# Patient Record
Sex: Female | Born: 1960
Health system: Southern US, Community
[De-identification: ages and names within clinical notes are randomized; demographics above are authoritative.]

## PROBLEM LIST (undated history)

## (undated) DIAGNOSIS — E119 Type 2 diabetes mellitus without complications: Secondary | ICD-10-CM

## (undated) DIAGNOSIS — C50911 Malignant neoplasm of unspecified site of right female breast: Secondary | ICD-10-CM

## (undated) DIAGNOSIS — M19019 Primary osteoarthritis, unspecified shoulder: Secondary | ICD-10-CM

## (undated) DIAGNOSIS — I10 Essential (primary) hypertension: Secondary | ICD-10-CM

## (undated) DIAGNOSIS — E785 Hyperlipidemia, unspecified: Secondary | ICD-10-CM

## (undated) DIAGNOSIS — Z923 Personal history of irradiation: Secondary | ICD-10-CM

## (undated) DIAGNOSIS — N63 Unspecified lump in unspecified breast: Secondary | ICD-10-CM

## (undated) DIAGNOSIS — Z9221 Personal history of antineoplastic chemotherapy: Secondary | ICD-10-CM

## (undated) HISTORY — DX: Unspecified lump in unspecified breast: N63.0

## (undated) HISTORY — DX: Primary osteoarthritis, unspecified shoulder: M19.019

## (undated) HISTORY — DX: Type 2 diabetes mellitus without complications: E11.9

## (undated) HISTORY — DX: Essential (primary) hypertension: I10

## (undated) HISTORY — DX: Malignant neoplasm of unspecified site of right female breast: C50.911

## (undated) HISTORY — DX: Hyperlipidemia, unspecified: E78.5

---

## 2003-07-28 DIAGNOSIS — I10 Essential (primary) hypertension: Secondary | ICD-10-CM

## 2003-07-28 HISTORY — DX: Essential (primary) hypertension: I10

## 2008-12-25 DIAGNOSIS — C50911 Malignant neoplasm of unspecified site of right female breast: Secondary | ICD-10-CM

## 2008-12-25 HISTORY — DX: Malignant neoplasm of unspecified site of right female breast: C50.911

## 2009-07-27 HISTORY — PX: BREAST LUMPECTOMY: SHX2

## 2010-12-11 ENCOUNTER — Ambulatory Visit (INDEPENDENT_AMBULATORY_CARE_PROVIDER_SITE_OTHER): Payer: Self-pay | Admitting: Family Medicine

## 2010-12-11 ENCOUNTER — Encounter: Payer: Self-pay | Admitting: Family Medicine

## 2010-12-11 DIAGNOSIS — I10 Essential (primary) hypertension: Secondary | ICD-10-CM

## 2010-12-11 DIAGNOSIS — E785 Hyperlipidemia, unspecified: Secondary | ICD-10-CM

## 2010-12-11 DIAGNOSIS — Z853 Personal history of malignant neoplasm of breast: Secondary | ICD-10-CM | POA: Insufficient documentation

## 2010-12-11 DIAGNOSIS — K219 Gastro-esophageal reflux disease without esophagitis: Secondary | ICD-10-CM

## 2010-12-11 DIAGNOSIS — C50919 Malignant neoplasm of unspecified site of unspecified female breast: Secondary | ICD-10-CM

## 2010-12-11 DIAGNOSIS — C50911 Malignant neoplasm of unspecified site of right female breast: Secondary | ICD-10-CM

## 2010-12-11 MED ORDER — PRAVASTATIN SODIUM 40 MG PO TABS: 40.0000 mg | ORAL_TABLET | Freq: Every evening | ORAL | Status: DC

## 2010-12-11 MED ORDER — RANITIDINE HCL 150 MG PO TABS: 150.0000 mg | ORAL_TABLET | Freq: Two times a day (BID) | ORAL | Status: DC

## 2010-12-11 MED ORDER — LISINOPRIL 10 MG PO TABS: 10.0000 mg | ORAL_TABLET | Freq: Every day | ORAL | Status: DC

## 2010-12-11 NOTE — Patient Instructions (Addendum)
Mrs. Carol Ward,  Thank you for coming in today. It was a pleasure meeting you and Mr. Leighton Roach I have sent prescriptions to your pharmacy. They are all $9 for 3 mos supply.   Please call and schedule an appointment with Jaynee Eagles if you have your orange card I will refer you to [facility] for your mammogram due in June.   Please return in 2-3 weeks (preferably after you have your orange card) for BP check-up.   Between now and when we see each other again, please walk for exercise 3-4 x per week.

## 2010-12-24 ENCOUNTER — Other Ambulatory Visit: Payer: Self-pay | Admitting: Family Medicine

## 2010-12-24 ENCOUNTER — Telehealth: Payer: Self-pay | Admitting: Family Medicine

## 2010-12-24 DIAGNOSIS — Z9889 Other specified postprocedural states: Secondary | ICD-10-CM

## 2010-12-24 NOTE — Telephone Encounter (Signed)
Mr. Khatoon called to inquire about referral his wife was waiting for to have a mammogram.  She presented the [facility] card from [facility], but still haven't heard anything.  According to notes from visit this was to be scheduled for June.  Please contact pt when referral and appt has been made

## 2010-12-25 NOTE — Telephone Encounter (Signed)
[facility] at 9am. Patient husband informed

## 2011-01-12 NOTE — Progress Notes (Signed)
  Subjective:    Patient ID: Carol Ward, female    DOB: [birth year], 50 y.o.   MRN: [id number]  HPI New patient to [facility] here to establish primary care. Patient has moved to [place] from [place] where she previously received her care. She has a past medical history significant for R breast cancer (dx June 2010) s/p chemotherapy, lumpectomy (Jan 2011) and external beam radiation therapy (Feb-April 2011) , HLD, HTN, GERD and situational depression/anxiety.   Regarding her R breast cancer, she is currently cancer free per her last biopsy and mammogram and is now on the schedule for yearly mammograms, next mammogram is due January 07, 2011. She denies breast pain, nipple discharge or new lumps on self breast exam.   Regarding her HTN she is taking an ACE from [place] (Blokium which is equivalent to lisinopril) since 2005. She states the she experiences headaches when her BP is high. She denies CP, SOB, LE edema. She has blood work  done at [facility] in [place] on 12/01/10. Her Cr 0.82, BUN 96.   Regarding her HLD she is taking a statin. On her blood work from earlier in the month, her Chol was 221, with LDL 107, HDL 44, TG 350. She is trending down compared to blood work from 2011. Her LFTs are all within normal limits and she denies muscle aches and pain.   Review of Systems  Constitutional: Negative.   Cardiovascular: Negative.   Neurological: Positive for headaches. Negative for dizziness, tremors, seizures, syncope, facial asymmetry, speech difficulty, weakness, light-headedness and numbness.  Hematological: Negative.        Objective:   Physical Exam  Nursing note and vitals reviewed. Constitutional: She is oriented to person, place, and time. She appears well-developed and well-nourished.  HENT:  Head: Normocephalic and atraumatic.  Eyes: No scleral icterus.  Neck: Normal range of motion. Neck supple.  Cardiovascular: Normal rate, regular rhythm, normal heart sounds and intact distal  pulses.   Pulmonary/Chest: Effort normal and breath sounds normal. Right breast exhibits skin change. Right breast exhibits no inverted nipple, no mass, no nipple discharge and no tenderness. Left breast exhibits no inverted nipple, no mass, no nipple discharge, no skin change and no tenderness. Breasts are symmetrical.    Neurological: She is alert and oriented to person, place, and time. No cranial nerve deficit. Coordination normal.  Skin: Skin is warm and dry.          Assessment & Plan:

## 2011-01-20 ENCOUNTER — Encounter: Payer: Self-pay | Admitting: Family Medicine

## 2011-01-20 NOTE — Assessment & Plan Note (Signed)
Patient is asymptomatic currently. Plan to continue H2 blocker.

## 2011-01-20 NOTE — Assessment & Plan Note (Addendum)
Patient due for screening mammogram in June. Will refer once pt obtains orange card. Reviewed provided medical records, including labs, path reports. Will add pertinent information to patient's chart.

## 2011-01-20 NOTE — Assessment & Plan Note (Signed)
Improving. Plan to continue statin therapy.  Advise low fat diet and regular exercise plan.

## 2011-01-20 NOTE — Assessment & Plan Note (Signed)
Pt with slightly elevated BP at this visit and with report of symptom when BP elevated. Plan to start continue lisinopril, will start at 10 mg PO q D and gradually increase dose to maintain adequate control.  Will ask pt to check BP at home and keep log and associated symptoms.

## 2011-01-26 ENCOUNTER — Ambulatory Visit
Admission: RE | Admit: 2011-01-26 | Discharge: 2011-01-26 | Disposition: A | Discharge status: Home / Self Care | Payer: Self-pay | Source: Ambulatory Visit | Attending: Family Medicine | Admitting: Family Medicine

## 2011-01-26 DIAGNOSIS — Z9889 Other specified postprocedural states: Secondary | ICD-10-CM

## 2011-01-29 ENCOUNTER — Ambulatory Visit (INDEPENDENT_AMBULATORY_CARE_PROVIDER_SITE_OTHER): Payer: Self-pay | Admitting: Family Medicine

## 2011-01-29 ENCOUNTER — Encounter: Payer: Self-pay | Admitting: Family Medicine

## 2011-01-29 VITALS — BP 110/76 | HR 82 | Temp 98.3°F | Wt 189.0 lb

## 2011-01-29 DIAGNOSIS — M19019 Primary osteoarthritis, unspecified shoulder: Secondary | ICD-10-CM

## 2011-01-29 DIAGNOSIS — Z853 Personal history of malignant neoplasm of breast: Secondary | ICD-10-CM

## 2011-01-29 DIAGNOSIS — I10 Essential (primary) hypertension: Secondary | ICD-10-CM

## 2011-01-29 HISTORY — DX: Primary osteoarthritis, unspecified shoulder: M19.019

## 2011-01-29 MED ORDER — CALCIUM CARBONATE-VITAMIN D 500-200 MG-UNIT PO TABS: 1.0000 | ORAL_TABLET | Freq: Every day | ORAL | Status: DC

## 2011-01-29 MED ORDER — LISINOPRIL 5 MG PO TABS: 5.0000 mg | ORAL_TABLET | Freq: Every day | ORAL | Status: DC

## 2011-01-29 MED ORDER — IBUPROFEN 800 MG PO TABS: 800.0000 mg | ORAL_TABLET | Freq: Three times a day (TID) | ORAL | Status: AC | PRN

## 2011-01-29 NOTE — Assessment & Plan Note (Addendum)
Likely source of pain. Given pt's history of malignancy concern for another malignancy, but pt denies nighttime symptoms, fever, chills, wt loss.  Plan: advil, Vit D and Calcium, weight bearing exercises as tolerated (limit weight bearing on R side).  Low threshold to obtain images of b/l shoulder and chest if symptoms worsen , nighttime symptoms occur given pt's risk of radiation induced sarcoma or secondary malignancy.

## 2011-01-29 NOTE — Patient Instructions (Signed)
Carol Ward,  Thank you for coming in today.  Your intermittent shoulder pain sounds like osteoarthritis you can use warm compresses and take ibuprofen when you have the pain or when you will be very active. Please start taking Calcium and Vitamin D.  F/u in 1 mos for BP check since we are changing the dose from 10 mg to 5 mg daily.   -Dr Armen Pickup

## 2011-01-29 NOTE — Assessment & Plan Note (Signed)
BP well controlled today, low/normal per pt log from home. Plan to decrease lisinopril from 10 to 5 mg PO q D. F/u med adjustment in 1 mos.

## 2011-01-29 NOTE — Progress Notes (Signed)
  Subjective:    Patient ID: Carol Ward, female    DOB: 1960-08-16, 50 y.o.   MRN: [id number]  HPI Subjective:  Carol Ward is a 50 y.o. female with hypertension. Current Outpatient Prescriptions  Medication Sig Dispense Refill  . lisinopril (PRINIVIL,ZESTRIL) 10 MG tablet Take 1 tablet (10 mg total) by mouth daily.  90 tablet  11  . pravastatin (PRAVACHOL) 40 MG tablet Take 1 tablet (40 mg total) by mouth every evening.  90 tablet  11  . ranitidine (ZANTAC) 150 MG tablet Take 1 tablet (150 mg total) by mouth 2 (two) times daily.  180 tablet  1    Hypertension ROS: taking medications as instructed, no medication side effects noted, no TIA's, no chest pain on exertion, no dyspnea on exertion and no swelling of ankles.  New concerns: B/l shoulder and arm pain and cracking for one month. Execrated by bending down during prayers. Pain is described in b/l shoulders as sharp. Limiting patients ability to do some activities (moving around serving dishes).  L sided pain last 2-3 days. Comes and goes. Denies N/V, change in appetite. No pain now. Pain located on L rib cage.   Objective:  BP 110/76  Pulse 82  Temp(Src) 98.3 F (36.8 C) (Oral)  Wt 189 lb (85.73 kg)  LMP 02/05/2009  Appearance alert, well appearing, and in no distress and overweight. General exam BP noted to be well controlled today in office, S1, S2 normal, no gallop, no murmur, chest clear, no JVD, no HSM, no edema, CVS exam  - normal rate, regular rhythm, normal S1, S2, no murmurs, rubs, clicks or gallops.  MSK: full ROM of b/l UE. Mild TTP b/l shoulders. Pt able to lift and hold arms overhead without worsening pain or arm fatigue.  Lab review: labs are reviewed, up to date and normal.   Assessment:   Hypertension well controlled.   Plan:  The following changes are to be made: decrease lisinoril from 0 to 5 mg PO q D.  Follow up: 1 month and as needed..    Review of Systems     Objective:   Physical Exam         Assessment & Plan:

## 2011-01-29 NOTE — Assessment & Plan Note (Signed)
Screening mammogram reviewed and normal.  F/u mammogram in one year.

## 2011-03-03 ENCOUNTER — Encounter: Payer: Self-pay | Admitting: Family Medicine

## 2011-03-03 ENCOUNTER — Other Ambulatory Visit: Payer: Self-pay | Admitting: Family Medicine

## 2011-03-03 ENCOUNTER — Ambulatory Visit (INDEPENDENT_AMBULATORY_CARE_PROVIDER_SITE_OTHER): Payer: Self-pay | Admitting: Family Medicine

## 2011-03-03 DIAGNOSIS — I10 Essential (primary) hypertension: Secondary | ICD-10-CM

## 2011-03-03 DIAGNOSIS — N6310 Unspecified lump in the right breast, unspecified quadrant: Secondary | ICD-10-CM | POA: Insufficient documentation

## 2011-03-03 DIAGNOSIS — N63 Unspecified lump in unspecified breast: Secondary | ICD-10-CM

## 2011-03-03 DIAGNOSIS — E785 Hyperlipidemia, unspecified: Secondary | ICD-10-CM

## 2011-03-03 DIAGNOSIS — K219 Gastro-esophageal reflux disease without esophagitis: Secondary | ICD-10-CM

## 2011-03-03 MED ORDER — RANITIDINE HCL 150 MG PO TABS: 150.0000 mg | ORAL_TABLET | Freq: Two times a day (BID) | ORAL | Status: DC

## 2011-03-03 MED ORDER — LISINOPRIL 5 MG PO TABS: 10.0000 mg | ORAL_TABLET | Freq: Every day | ORAL | Status: DC

## 2011-03-03 MED ORDER — PRAVASTATIN SODIUM 40 MG PO TABS: 40.0000 mg | ORAL_TABLET | Freq: Every evening | ORAL | Status: DC

## 2011-03-03 NOTE — Assessment & Plan Note (Addendum)
A: well controlled. At goal. Meds: pt compliant. No adverse SE. Plan: continue 10 mg PO q D.

## 2011-03-03 NOTE — Patient Instructions (Signed)
Carol Ward,  Thank you for coming in today.  Please go to the imaging center for a diagnostic ultrasound of that chest wall mass. I have sent a refill of your meds to [facility]  I will call you regarding f/u once I have the results of your ultrasound.   -Dr. Armen Pickup

## 2011-03-06 NOTE — Assessment & Plan Note (Signed)
A: Likely fibrosis from radiation. Mass is not cystic on exam. Pt with normal mammogram 2 mos ago. Given history of invasive breast cancer concern for malignancy is high. P: Referral for diagnostic mammogram and ultrasound. Will f/u results.

## 2011-03-06 NOTE — Progress Notes (Signed)
  Subjective:    Patient ID: Carol Ward, female    DOB: Mar 22, 1961, 50 y.o.   MRN: [id number]  HPI Pt here to f/u HTN and to discuss R breast/chest wall mass. 1. HTN. Compliant with lisinopril. Is taking 10 mg daily. Felt the BP was elevated when she took 5 mg reports feeling "bad". She gave BP ranges of 93-104/76 when taking 10 mg. And SBPs of 120-130s over DBP 80-96  when taking 5. She denies CP, SOB, syncope, cough.  2. R breast/chest wall mass: she noticed this about 1 month ago. She has had similar masses and came and went since she had her radiation for R breast cancer. She states that he mass is persistent. Somewhat tender. She denies fever, chills, weight loss, recent trauma to the area or itching.    Review of Systems As per HPI    Objective:   Physical Exam  Nursing note and vitals reviewed. Constitutional: She appears well-developed and well-nourished.  Cardiovascular: Normal rate, regular rhythm, normal heart sounds and intact distal pulses.   Pulmonary/Chest: Effort normal and breath sounds normal.    Musculoskeletal: She exhibits no edema.          Assessment & Plan:

## 2011-03-09 ENCOUNTER — Ambulatory Visit
Admission: RE | Admit: 2011-03-09 | Discharge: 2011-03-09 | Disposition: A | Discharge status: Home / Self Care | Payer: Self-pay | Source: Ambulatory Visit | Attending: Family Medicine | Admitting: Family Medicine

## 2011-03-09 DIAGNOSIS — N6310 Unspecified lump in the right breast, unspecified quadrant: Secondary | ICD-10-CM

## 2011-04-17 ENCOUNTER — Telehealth: Payer: Self-pay | Admitting: Family Medicine

## 2011-04-17 ENCOUNTER — Other Ambulatory Visit: Payer: Self-pay | Admitting: Family Medicine

## 2011-04-17 MED ORDER — QUINAPRIL HCL 10 MG PO TABS: 10.0000 mg | ORAL_TABLET | Freq: Every day | ORAL | Status: DC

## 2011-04-17 MED ORDER — ROSUVASTATIN CALCIUM 20 MG PO TABS: 20.0000 mg | ORAL_TABLET | Freq: Every day | ORAL | Status: DC

## 2011-04-17 NOTE — Telephone Encounter (Signed)
Received change in medication request from [facility] for free accupril and crestor/lipitor. Called pt to tell her that I would change her medications and send in the requested documentation, BPs and bloodwork. Left voicemail.

## 2011-05-13 ENCOUNTER — Other Ambulatory Visit: Payer: Self-pay | Admitting: Family Medicine

## 2011-05-13 DIAGNOSIS — M7989 Other specified soft tissue disorders: Secondary | ICD-10-CM

## 2011-06-15 ENCOUNTER — Other Ambulatory Visit: Payer: Self-pay | Admitting: Family Medicine

## 2011-06-15 ENCOUNTER — Ambulatory Visit
Admission: RE | Admit: 2011-06-15 | Discharge: 2011-06-15 | Disposition: A | Discharge status: Home / Self Care | Payer: Self-pay | Source: Ambulatory Visit | Attending: Internal Medicine | Admitting: Internal Medicine

## 2011-06-15 DIAGNOSIS — M7989 Other specified soft tissue disorders: Secondary | ICD-10-CM

## 2011-06-25 ENCOUNTER — Telehealth: Payer: Self-pay | Admitting: Family Medicine

## 2011-06-25 ENCOUNTER — Other Ambulatory Visit: Payer: Self-pay | Admitting: Family Medicine

## 2011-06-25 NOTE — Telephone Encounter (Signed)
Please call about in basket report that was sent to you for patient.  Call her at this number instead  [phone]

## 2011-06-25 NOTE — Telephone Encounter (Signed)
Called Carol Ward. Left VM letting her know that I reviewed the report. I asked her to call me back at clinic tomorrow or page me if she needs to speak with me urgently.

## 2011-07-06 ENCOUNTER — Other Ambulatory Visit: Payer: Self-pay

## 2011-12-25 ENCOUNTER — Encounter: Payer: Self-pay | Admitting: Family Medicine

## 2011-12-25 ENCOUNTER — Ambulatory Visit (INDEPENDENT_AMBULATORY_CARE_PROVIDER_SITE_OTHER): Payer: Self-pay | Admitting: Family Medicine

## 2011-12-25 VITALS — BP 108/73 | HR 92 | Temp 98.2°F | Ht 66.75 in | Wt 195.0 lb

## 2011-12-25 DIAGNOSIS — N6310 Unspecified lump in the right breast, unspecified quadrant: Secondary | ICD-10-CM

## 2011-12-25 DIAGNOSIS — N63 Unspecified lump in unspecified breast: Secondary | ICD-10-CM

## 2011-12-25 NOTE — Assessment & Plan Note (Signed)
Patient with lump of right breast that is causing pain and seems to be growing. Previously evaluated with ultrasound and mammogram. Previous cancer doctor in [place] and has not been seen since 2011. Consider biopsy and further evaluation for this patient with previous history of poorly differentiated intraductal carcinoma. Will send to surgery for evaluation.

## 2011-12-25 NOTE — Patient Instructions (Signed)
I have put in an order for your mammogram We are making an appointment for surgery.  I would like you to see them to be evaluated for possible biopsy

## 2011-12-25 NOTE — Progress Notes (Signed)
  Subjective:    Patient ID: Carol Ward, female    DOB: January 15, 1961, 51 y.o.   MRN: [id number]  HPI Presents for followup of lump mid axillary line on the right. She thinks it is enlarging and hurting. It feels like the pain radiates to her shoulder. She denies fevers, chills, weight loss. She denies nipple discharge. She has previously had chemotherapy, radiation, lumpectomy of this breast. She is last been seen by the surgeon who did the surgery in 2011. The surgeon was in [place] and she does not have a doctor to follow this year.  This lump has previously been followed with mammogram and ultrasound. The previous cancer is noted as a poorly differentiated intraductal carcinoma. Review of Systems See above     Objective:   Physical Exam  Vital signs reviewed General appearance - alert, well appearing, and in no distress Breast-surgical changes present on the right. There is an area approximately 1 cm large that is mobile and rubbery in the mid axillary line. No enlarged nodes in the axilla.      Assessment & Plan:

## 2011-12-29 ENCOUNTER — Other Ambulatory Visit: Payer: Self-pay | Admitting: Family Medicine

## 2011-12-29 MED ORDER — PRAVASTATIN SODIUM 40 MG PO TABS: 40.0000 mg | ORAL_TABLET | Freq: Every day | ORAL | Status: DC

## 2012-01-12 ENCOUNTER — Telehealth: Payer: Self-pay | Admitting: Family Medicine

## 2012-01-12 ENCOUNTER — Other Ambulatory Visit: Payer: Self-pay | Admitting: Family Medicine

## 2012-01-12 MED ORDER — LISINOPRIL 10 MG PO TABS: 10.0000 mg | ORAL_TABLET | Freq: Every day | ORAL | Status: DC

## 2012-01-12 NOTE — Telephone Encounter (Signed)
Patients husband is calling to speak with MD about his wifes Mammogram.

## 2012-01-15 NOTE — Telephone Encounter (Signed)
Called back and spoke to husband. His wife has an appt with a surgeon  she was referred by Dr. Hulen Luster and wants to know if they should go. I informed them that if there seems to be significant pain or changes in the breast they should. If not, they do not have to. There is already a plan for a f/u diagnostic mammogram for January 27, 2012.

## 2012-01-19 ENCOUNTER — Encounter (INDEPENDENT_AMBULATORY_CARE_PROVIDER_SITE_OTHER): Payer: Self-pay | Admitting: General Surgery

## 2012-01-19 ENCOUNTER — Ambulatory Visit (INDEPENDENT_AMBULATORY_CARE_PROVIDER_SITE_OTHER): Payer: PRIVATE HEALTH INSURANCE | Admitting: General Surgery

## 2012-01-19 VITALS — BP 124/80 | HR 68 | Temp 97.3°F | Resp 14 | Ht 67.0 in | Wt 195.4 lb

## 2012-01-19 DIAGNOSIS — N6310 Unspecified lump in the right breast, unspecified quadrant: Secondary | ICD-10-CM

## 2012-01-19 DIAGNOSIS — N63 Unspecified lump in unspecified breast: Secondary | ICD-10-CM

## 2012-01-19 NOTE — Assessment & Plan Note (Signed)
Pt with palpable area of thickening in anterior axillary line around 2 cm anterior to radiation tattoo. Likely benign.  Does not seem to have changed since description in November imaging.  Has follow up mammogram next week. Marked area with marker and tegaderm.  Even if negative films, would follow up in 3 months.

## 2012-01-19 NOTE — Patient Instructions (Addendum)
Get special attention to spot that I marked on mammogram next week.  Even if they say mammogram is OK, I want to see you back in 3 months to reexamine.

## 2012-01-19 NOTE — Progress Notes (Signed)
Chief Complaint  Patient presents with  . Mass    Breast - history of breast cancer    HISTORY: Patient had right breast cancer in 2010 in [place].  She had lumpectomy, sentinel lymph node biopsy, chemo and radiation for treatment.  She has area in her right axilla/chest wall that has been sore.  She moved here in the last year to [facility].  She has gotten a mammogram/ultrasound last fall that did not see any architectural distortion or mass in the area of thickening.  It does not hurt all the time, but does bother her frequently.  She has not had any other biopsies since the original surgery.  She has no family history of cancer.    Past Medical History  Diagnosis Date  . Hyperlipidemia 6/31/2011    Taking Simvistatin. LDL 217--127-107  . Hypertension 2005    Treated with ACE (Blokium in [place])  . Osteoarthritis of shoulder region 01/29/2011  . Breast cancer, right breast June 2010    Poorly differentiated intraductal carcinoma. s/p chemo, radiation and lumpectomy. Triple recepetro negative. BRAC1 adn BRCA2 negative.  . Lump or mass in breast     Past Surgical History  Procedure Date  . Breast lumpectomy Jan 2011    R breast     Current Outpatient Prescriptions  Medication Sig Dispense Refill  . lisinopril (PRINIVIL,ZESTRIL) 10 MG tablet Take 1 tablet (10 mg total) by mouth daily.  90 tablet  3  . pravastatin (PRAVACHOL) 40 MG tablet Take 1 tablet (40 mg total) by mouth daily.  90 tablet  3     No Known Allergies   History reviewed. No pertinent family history.   History   Social History  . Marital Status: Married    Spouse Name: N/A    Number of Children: 1  . Years of Education: N/A   Occupational History  . unemployed    Social History Main Topics  . Smoking status: Never Smoker   . Smokeless tobacco: Never Used  . Alcohol Use: No  . Drug Use: No  . Sexually Active: Yes -- Female partner(s)    Birth Control/ Protection: Post-menopausal   Other Topics Concern    . None   Social History Narrative   Moved to [place] 7 mos ago.Lives with husband [facility]) of 15 years and 35 yo son Alvenia Treese). Originally from [place].      REVIEW OF SYSTEMS - PERTINENT POSITIVES ONLY: 12 point review of systems negative other than HPI and PMH.  EXAM: Filed Vitals:   01/19/12 1013  BP: 124/80  Pulse: 68  Temp: 97.3 F (36.3 C)  Resp: 14    Gen:  No acute distress.  Well nourished and well groomed.   Neurological: Alert and oriented to person, place, and time. Coordination normal.  Head: Normocephalic and atraumatic.  Eyes: Conjunctivae are normal. Pupils are equal, round, and reactive to light. No scleral icterus.  Neck: Normal range of motion. Neck supple. No tracheal deviation or thyromegaly present.  Cardiovascular: Normal rate, regular rhythm, normal heart sounds and intact distal pulses.  Exam reveals no gallop and no friction rub.  No murmur heard. Respiratory: Effort normal.  No respiratory distress. No chest wall tenderness. Breath sounds normal.  No wheezes, rales or rhonchi.  Breast:  The right breast incision is under the nipple.  There is a little architectural distortion.  There is no mass in either breast proper.  There is a 1.5 cm area of thickening at 9  oclock near prior radiation tattoo.  This is mobile.   GI: Soft. Bowel sounds are normal. The abdomen is soft and nontender.  There is no rebound and no guarding.  Musculoskeletal: Normal range of motion. Extremities are nontender.  Lymphadenopathy: No cervical, preauricular, postauricular or axillary adenopathy is present.  There is no axillary lymphadenopathy.   Skin: Skin is warm and dry. No rash noted. No diaphoresis. No erythema. No pallor. No clubbing, cyanosis, or edema.   Psychiatric: Normal mood and affect. Behavior is normal. Judgment and thought content normal.    LABORATORY RESULTS: None available.     RADIOLOGY RESULTS: Fibrofatty tissue in the area of concern on  mammogram/ultrasound 05/2011    ASSESSMENT AND PLAN: Lump of breast, right Pt with palpable area of thickening in anterior axillary line around 2 cm anterior to radiation tattoo. Likely benign.  Does not seem to have changed since description in November imaging.  Has follow up mammogram next week. Marked area with marker and tegaderm.  Even if negative films, would follow up in 3 months.       Maudry Diego MD Surgical Oncology, General and Endocrine Surgery [facility]      Visit Diagnoses: 1. Lump of breast, right     Primary Care Physician: Dessa Phi, MD

## 2012-02-01 ENCOUNTER — Other Ambulatory Visit: Payer: Self-pay | Admitting: Family Medicine

## 2012-02-01 ENCOUNTER — Ambulatory Visit
Admission: RE | Admit: 2012-02-01 | Discharge: 2012-02-01 | Disposition: A | Discharge status: Home / Self Care | Payer: Self-pay | Source: Ambulatory Visit | Attending: Family Medicine | Admitting: Family Medicine

## 2012-02-01 DIAGNOSIS — N6310 Unspecified lump in the right breast, unspecified quadrant: Secondary | ICD-10-CM

## 2012-04-08 ENCOUNTER — Encounter (INDEPENDENT_AMBULATORY_CARE_PROVIDER_SITE_OTHER): Payer: Self-pay | Admitting: General Surgery

## 2012-04-08 ENCOUNTER — Ambulatory Visit (INDEPENDENT_AMBULATORY_CARE_PROVIDER_SITE_OTHER): Payer: PRIVATE HEALTH INSURANCE | Admitting: General Surgery

## 2012-04-08 VITALS — BP 118/86 | HR 98 | Temp 97.8°F | Resp 16 | Ht 67.0 in | Wt 194.4 lb

## 2012-04-08 DIAGNOSIS — Z853 Personal history of malignant neoplasm of breast: Secondary | ICD-10-CM

## 2012-04-08 NOTE — Progress Notes (Signed)
Chief Complaint  Patient presents with  . Routine Post Op    reck breast    HISTORY: Patient had right breast cancer in 2010 in [place].  She had lumpectomy, sentinel lymph node biopsy, chemo and radiation for treatment.  She has area in her right axilla/chest wall that has been sore.  It has improved somewhat since her visit 3 months ago.  She subsequently underwent ultrasound that was benign.  She has not had any other issues.      REVIEW OF SYSTEMS - PERTINENT POSITIVES ONLY: 12 point review of systems negative other than HPI and PMH.  EXAM: Filed Vitals:   04/08/12 1217  BP: 118/86  Pulse: 98  Temp: 97.8 F (36.6 C)  Resp: 16    Gen:  No acute distress.  Well nourished and well groomed.   Neurological: Alert and oriented to person, place, and time. Coordination normal.  Head: Normocephalic and atraumatic.  Eyes: Conjunctivae are normal. Pupils are equal, round, and reactive to light. No scleral icterus.  Neck: Normal range of motion. Neck supple. No tracheal deviation or thyromegaly present.  Cardiovascular: Normal rate, regular rhythm, normal heart sounds and intact distal pulses.  Exam reveals no gallop and no friction rub.  No murmur heard. Respiratory: Effort normal.  No respiratory distress.  Breast:  The right breast incision is under the nipple.  There is a little architectural distortion as detected previously.  There is no mass in either breast proper.  The area of concern is slightly smaller at 1 cm at right breast 9 oclock near prior radiation tattoo in the mid axillary line. Musculoskeletal: Normal range of motion. Extremities are nontender.  Lymphadenopathy: No cervical, preauricular, postauricular or axillary adenopathy is present.  There is no axillary lymphadenopathy.   Skin: Skin is warm and dry. No rash noted. No diaphoresis. No erythema. No pallor. No clubbing, cyanosis, or edema.   Psychiatric: Normal mood and affect. Behavior is normal. Judgment and thought  content normal.    RADIOLOGY RESULTS: Fibrofatty tissue in the area of concern on mammogram/ultrasound 05/2011  Ultrasound is performed, showing normal tissue is seen in the 9  o'clock region of the right breast 20 cm from the nipple. No solid  or cystic mass, abnormal shadowing or enlarged adenopathy is  detected.    ASSESSMENT AND PLAN: History of breast cancer Area of concern is smaller.  Recommend follow up in 1 year with me. Stay on schedule for mammograms.  Follow up with an oncologist.        Maudry Diego MD Surgical Oncology, General and Endocrine Surgery [facility]      Visit Diagnoses: 1. History of breast cancer     Primary Care Physician: Dessa Phi, MD

## 2012-04-08 NOTE — Assessment & Plan Note (Signed)
Area of concern is smaller.  Recommend follow up in 1 year with me. Stay on schedule for mammograms.  Follow up with an oncologist.

## 2012-04-08 NOTE — Patient Instructions (Signed)
Follow up with me in 1 year.  Get mammogram as scheduled (?may)  See oncology as well.

## 2012-04-15 ENCOUNTER — Encounter (INDEPENDENT_AMBULATORY_CARE_PROVIDER_SITE_OTHER): Payer: Self-pay | Admitting: General Surgery

## 2012-04-29 ENCOUNTER — Telehealth: Payer: Self-pay | Admitting: Family Medicine

## 2012-04-29 NOTE — Telephone Encounter (Signed)
having blurry vision and wants to go to eye doctor - she has the orange card

## 2012-04-29 NOTE — Telephone Encounter (Signed)
Needs appt. Fleeger, Maryjo [place]

## 2012-05-10 ENCOUNTER — Ambulatory Visit: Payer: Self-pay | Admitting: Family Medicine

## 2012-05-12 ENCOUNTER — Encounter: Payer: Self-pay | Admitting: Family Medicine

## 2012-05-12 ENCOUNTER — Ambulatory Visit (INDEPENDENT_AMBULATORY_CARE_PROVIDER_SITE_OTHER): Payer: Self-pay | Admitting: Family Medicine

## 2012-05-12 VITALS — BP 115/72 | HR 102 | Temp 98.1°F | Ht 67.0 in | Wt 195.0 lb

## 2012-05-12 DIAGNOSIS — Z23 Encounter for immunization: Secondary | ICD-10-CM

## 2012-05-12 DIAGNOSIS — I10 Essential (primary) hypertension: Secondary | ICD-10-CM

## 2012-05-12 DIAGNOSIS — E785 Hyperlipidemia, unspecified: Secondary | ICD-10-CM

## 2012-05-12 DIAGNOSIS — K056 Periodontal disease, unspecified: Secondary | ICD-10-CM

## 2012-05-12 DIAGNOSIS — K068 Other specified disorders of gingiva and edentulous alveolar ridge: Secondary | ICD-10-CM

## 2012-05-12 DIAGNOSIS — H538 Other visual disturbances: Secondary | ICD-10-CM

## 2012-05-12 DIAGNOSIS — K069 Disorder of gingiva and edentulous alveolar ridge, unspecified: Secondary | ICD-10-CM

## 2012-05-12 LAB — POCT SEDIMENTATION RATE: POCT SED RATE: 14 mm/hr (ref 0–22)

## 2012-05-12 MED ORDER — LISINOPRIL 10 MG PO TABS: 5.0000 mg | ORAL_TABLET | Freq: Two times a day (BID) | ORAL | Status: DC

## 2012-05-12 NOTE — Progress Notes (Signed)
Subjective:     Patient ID: Capri Huppert, female   DOB: 03-07-1961, 51 y.o.   MRN: [id number]  HPI 51 yo F presents to discuss the following:  1. Blurred vision x 2 months. Associated with mild L temporal headache and L lower gum pain. Wears contacts and reading glasses. Last eye exam last year in [place] was normal. No new prescription needed. No headache at this time. Blurred vision persistent.   2. High cholesterol: compliant with statin. Would like fasting lipid panel.   3. HTN: BP elevated over last two weeks. SBPs 144/DBP 92. Denies CP, SOB, LE edema. Compliant with lisinopril. Taking 5 mg BID.   Review of Systems As per HPI     Objective:   Physical Exam BP 115/72  Pulse 102  Temp 98.1 F (36.7 C) (Oral)  Ht 5\' 7"  (1.702 m)  Wt 195 lb (88.451 kg)  BMI 30.54 kg/m2  LMP 02/05/2009 General appearance: alert, cooperative and no distress Head: Normocephalic, without obvious abnormality, atraumatic, no temporal tenderness  Eyes: conjunctivae/corneas clear. PERRL, EOM's intact. Fundi benign. Ears: normal TM's and external ear canals both ears Nose: Nares normal. Septum midline. Mucosa normal. No drainage or sinus tenderness. Throat: lips, mucosa, and tongue normal; teeth and gums normal except for dental carries.  Lungs: clear to auscultation bilaterally Heart: regular rate and rhythm, S1, S2 normal, no murmur, click, rub or gallop Extremities: extremities normal, atraumatic, no cyanosis or edema     Assessment and Plan:

## 2012-05-12 NOTE — Assessment & Plan Note (Signed)
A: compliant with medications. P: Fasting lipid panel ordered.

## 2012-05-12 NOTE — Assessment & Plan Note (Signed)
A: associated with mild headache. Reassuring exam in office.  P: Referral to optometry.  Check sed rate to rule out temporal arteritis.

## 2012-05-12 NOTE — Assessment & Plan Note (Signed)
A: BP at goal. Meds: compliant with and tolerating.  P: Continue current management.

## 2012-05-12 NOTE — Assessment & Plan Note (Signed)
Referral to dentistry 

## 2012-05-12 NOTE — Patient Instructions (Signed)
Carol Ward,  Thank you for coming in to see me today.  Please get your sed rate done today to rule out temporal arteritis.  Please schedule a lab visit for fasting lipid panel tomorrow.   I have placed referrals to optometry and dentistry. My clinic staff will call you regarding these.   Dr. Armen Pickup

## 2012-05-13 ENCOUNTER — Other Ambulatory Visit: Payer: Self-pay

## 2012-05-13 DIAGNOSIS — E785 Hyperlipidemia, unspecified: Secondary | ICD-10-CM

## 2012-05-13 NOTE — Progress Notes (Signed)
FLP DONE TODAY Carol Ward 

## 2012-05-14 LAB — LIPID PANEL
Cholesterol: 176 mg/dL (ref 0–200)
Total CHOL/HDL Ratio: 4.2 Ratio
Triglycerides: 216 mg/dL — ABNORMAL HIGH (ref <150)
VLDL: 43 mg/dL — ABNORMAL HIGH (ref 0–40)

## 2012-05-16 ENCOUNTER — Encounter: Payer: Self-pay | Admitting: Family Medicine

## 2012-07-01 ENCOUNTER — Telehealth: Payer: Self-pay | Admitting: Family Medicine

## 2012-07-01 NOTE — Telephone Encounter (Signed)
Needs to talk to nurse about being referred for glaucoma - went to regular eye doctor and was told she needs to go see a glaucoma specialist.

## 2012-07-01 NOTE — Telephone Encounter (Signed)
Advised husband that since pt has the orange card this could be a problem.  Agreeable to making appt. Fleeger, Maryjo [place]

## 2012-07-12 ENCOUNTER — Ambulatory Visit (INDEPENDENT_AMBULATORY_CARE_PROVIDER_SITE_OTHER): Payer: PRIVATE HEALTH INSURANCE | Admitting: Family Medicine

## 2012-07-12 ENCOUNTER — Encounter: Payer: Self-pay | Admitting: Family Medicine

## 2012-07-12 VITALS — BP 122/81 | HR 59 | Temp 98.5°F | Ht 67.0 in | Wt 197.0 lb

## 2012-07-12 DIAGNOSIS — H402290 Chronic angle-closure glaucoma, unspecified eye, stage unspecified: Secondary | ICD-10-CM

## 2012-07-12 DIAGNOSIS — H40059 Ocular hypertension, unspecified eye: Secondary | ICD-10-CM

## 2012-07-12 DIAGNOSIS — H40009 Preglaucoma, unspecified, unspecified eye: Secondary | ICD-10-CM

## 2012-07-12 DIAGNOSIS — H409 Unspecified glaucoma: Secondary | ICD-10-CM

## 2012-07-12 NOTE — Patient Instructions (Addendum)
Mr and Mrs. Houdek,  Thank you for coming in today.  It appears that you have chronic closed angle glaucoma. Elevated eye pressure due to lack of fluid drainage. This is a serious problem that threatens the vision and will lead to blindness is untreated.   I spoke to Dr. Parke Simmers today. Her office staff have coordinated a visit with a glaucoma specialist at [facility]). The cost of the consultation is $150. You will have to set up a payment plan for the procedure. Please keep this appointment. This is an urgent matter.   If you do not receive a call from Dr. Tedra Senegal tech this morning please call them for the appointment details at [phone].  Dr. Armen Pickup

## 2012-07-12 NOTE — Assessment & Plan Note (Signed)
F/u with glaucoma specialist per AVS.

## 2012-07-12 NOTE — Progress Notes (Signed)
Subjective:     Patient ID: Carol Ward, female   DOB: February 09, 1961, 51 y.o.   MRN: [id number]  HPI 51 yo F presents with her husband to discuss a referral to a glaucoma specialist. She has been evaluated twice by Dr. Parke Simmers OD ([facility] [phone]) and has been diagnosed with chronic closed angle glaucoma with iuntraocular pressures in the 40s. She denies vision loss. She and her husband are concerned about the cost of the visit which will not be covered by project access (orange card), and want know if they can wait for a provider that take her insurance.   I spoke to Dr. Rodrigo Ran, [facility] [phone] on the phone while the patient was in the office. Dr. Tedra Senegal office staff have coordinated a visit with a glaucoma specialist at [facility]). The cost of the consultation is $150. The patieny will have to set up a payment plan for the procedure (laser eye surgery).   I discussed with the patient that the matter was urgent. Unfortunately, there is no urgent appointments available ophthalmology/glaucoma specialist. I stress to her the importance of the situation and advised that she keep the appointment with the glaucoma specialist at [facility].   Review of Systems As per HPI    Objective:   Physical Exam BP 122/81  Pulse 59  Temp 98.5 F (36.9 C) (Oral)  Ht 5\' 7"  (1.702 m)  Wt 197 lb (89.359 kg)  BMI 30.85 kg/m2  LMP 02/05/2009 General appearance: alert, cooperative and no distress     Assessment and Plan:

## 2012-08-25 ENCOUNTER — Telehealth: Payer: Self-pay | Admitting: Family Medicine

## 2012-08-25 DIAGNOSIS — H538 Other visual disturbances: Secondary | ICD-10-CM

## 2012-08-25 NOTE — Telephone Encounter (Signed)
Husband is calling for a referral for a specialist for contacts.  The eye doctor they went to is recommending Dr. Lonia Chimera.

## 2012-08-25 NOTE — Telephone Encounter (Signed)
Spoke with pts husband.  The ophthalmologist that saw her only specializes in glaucoma.  They would like to see an optometrist so she can check what her vision is and get the rx's from [place].  I advised that orange card may not cover this but we could send a referral and see what they say.  Pts husband is agreeable. Will forward to MD for referral placement. Milyn Stapleton, Maryjo Rochester

## 2012-08-25 NOTE — Telephone Encounter (Signed)
Referral placed.

## 2012-10-03 ENCOUNTER — Telehealth: Payer: Self-pay | Admitting: Family Medicine

## 2012-10-03 NOTE — Telephone Encounter (Signed)
Husband is calling because the medication Pravastatin is too expensive and the pharmacist suggested Lovastatin at a 90 day supply.  This will save them $30.  The patient does have about 2 - 3 days left.  They use [facility] on [address].

## 2012-10-04 MED ORDER — LOVASTATIN 40 MG PO TABS: 40.0000 mg | ORAL_TABLET | Freq: Every day | ORAL | Status: DC

## 2012-10-04 NOTE — Telephone Encounter (Signed)
Called patient. Ok to change. Change made with 90 day supply

## 2012-10-05 ENCOUNTER — Other Ambulatory Visit: Payer: Self-pay | Admitting: Family Medicine

## 2012-10-05 MED ORDER — LOVASTATIN 20 MG PO TABS: 40.0000 mg | ORAL_TABLET | Freq: Every day | ORAL | Status: DC

## 2012-12-26 ENCOUNTER — Other Ambulatory Visit: Payer: Self-pay | Admitting: Family Medicine

## 2012-12-26 DIAGNOSIS — Z853 Personal history of malignant neoplasm of breast: Secondary | ICD-10-CM

## 2013-02-01 ENCOUNTER — Ambulatory Visit
Admission: RE | Admit: 2013-02-01 | Discharge: 2013-02-01 | Disposition: A | Discharge status: Home / Self Care | Payer: No Typology Code available for payment source | Source: Ambulatory Visit | Attending: Family Medicine | Admitting: Family Medicine

## 2013-02-01 DIAGNOSIS — Z853 Personal history of malignant neoplasm of breast: Secondary | ICD-10-CM

## 2013-02-27 ENCOUNTER — Telehealth: Payer: Self-pay | Admitting: *Deleted

## 2013-02-27 NOTE — Telephone Encounter (Signed)
Request form completed by patient's husband.  Form states "No complaint.  Prefers to be seen by female doctor.  More comfortable with female doctor."   Changed PCP from Dr. Konrad Dolores to Dr. Benjamin Stain.  Senaida Ores, Maryjean Ka, RN

## 2013-02-27 NOTE — Telephone Encounter (Signed)
Carol Ward,  The request was for his wife, not him. That being said if he would prefer to have both w/ 1 provider then making change is fine.

## 2013-02-27 NOTE — Telephone Encounter (Signed)
Husband does not want to change his PCP and will continue to have Dr. Konrad Dolores as PCP.  Gaylene Brooks, RN

## 2013-02-28 ENCOUNTER — Ambulatory Visit: Payer: No Typology Code available for payment source

## 2013-03-01 NOTE — Telephone Encounter (Signed)
Thanks for letting me know!

## 2013-03-28 ENCOUNTER — Other Ambulatory Visit: Payer: Self-pay | Admitting: *Deleted

## 2013-03-28 MED ORDER — LISINOPRIL 5 MG PO TABS: 5.0000 mg | ORAL_TABLET | Freq: Two times a day (BID) | ORAL | Status: DC

## 2013-04-03 ENCOUNTER — Other Ambulatory Visit: Payer: Self-pay | Admitting: Family Medicine

## 2013-04-03 MED ORDER — LISINOPRIL 5 MG PO TABS: 5.0000 mg | ORAL_TABLET | Freq: Two times a day (BID) | ORAL | Status: DC

## 2013-04-04 ENCOUNTER — Other Ambulatory Visit: Payer: Self-pay | Admitting: Family Medicine

## 2013-04-04 MED ORDER — LISINOPRIL 10 MG PO TABS: 10.0000 mg | ORAL_TABLET | Freq: Every day | ORAL | Status: DC

## 2013-04-17 ENCOUNTER — Encounter (INDEPENDENT_AMBULATORY_CARE_PROVIDER_SITE_OTHER): Payer: Self-pay | Admitting: General Surgery

## 2013-04-17 ENCOUNTER — Ambulatory Visit (INDEPENDENT_AMBULATORY_CARE_PROVIDER_SITE_OTHER): Payer: PRIVATE HEALTH INSURANCE | Admitting: General Surgery

## 2013-04-17 VITALS — BP 116/80 | HR 80 | Temp 97.4°F | Resp 15 | Ht 68.0 in | Wt 193.2 lb

## 2013-04-17 DIAGNOSIS — Z853 Personal history of malignant neoplasm of breast: Secondary | ICD-10-CM

## 2013-04-17 NOTE — Patient Instructions (Addendum)
Get mammogram next summer.  Follow up in 1 year unless you find concerning spot in breast.    Call earlier if needed.

## 2013-04-21 NOTE — Assessment & Plan Note (Addendum)
No evidence of disease.  Follow up next summer with mammogram, then 1 year with me.    Follow up with oncology

## 2013-04-25 NOTE — Progress Notes (Signed)
Chief Complaint  Patient presents with  . Breast Cancer Long Term Follow Up    LTFU yearly br recheck    HISTORY: Patient had right breast cancer in 2010 in [place].  She had lumpectomy, sentinel lymph node biopsy, chemo and radiation for treatment.  She had area in her right axilla/chest wall that had been sore. This is not bothering her anymore.     She had normal mammogram in July that was reassuring.  She has not had any new breast masses or areas of concern.     REVIEW OF SYSTEMS - PERTINENT POSITIVES ONLY: 12 point review of systems negative other than HPI and PMH.  EXAM: Filed Vitals:   04/17/13 1055  BP: 116/80  Pulse: 80  Temp: 97.4 F (36.3 C)  Resp: 15    Gen:  No acute distress.  Well nourished and well groomed.   Neurological: Alert and oriented to person, place, and time. Coordination normal.  Head: Normocephalic and atraumatic.  Eyes: Conjunctivae are normal. Pupils are equal, round, and reactive to light. No scleral icterus.  Neck: Normal range of motion. Neck supple. No tracheal deviation or thyromegaly present.  Cardiovascular: Normal rate, regular rhythm Respiratory: Effort normal.  No respiratory distress.  Breast:  The right breast incision is under the nipple.  There are no masses or skin dimpling.  She does not have any lymphadenopathy.  She has no nipple retraction nipple discharge.   Musculoskeletal: Normal range of motion. Extremities are nontender.  Lymphadenopathy: No cervical, preauricular, postauricular or axillary adenopathy is present.  There is no axillary lymphadenopathy.   Skin: Skin is warm and dry. No rash noted. No diaphoresis. No erythema. No pallor. No clubbing, cyanosis, or edema.   Psychiatric: Normal mood and affect. Behavior is normal. Judgment and thought content normal.    RADIOLOGY RESULTS:  Mammogram 01/2013 IMPRESSION:  No mammographic evidence of breast malignancy.  Right breast scarring.  BI-RADS CATEGORY 2: Benign  finding(s).     ASSESSMENT AND PLAN: History of breast cancer No evidence of disease.  Follow up next summer with mammogram  Follow up in 1 year.  At that point, she will be 5 years out, and can likely move to follow up with PCP.  Follow up with oncology       Maudry Diego MD Surgical Oncology, General and Endocrine Surgery [facility]      Visit Diagnoses: 1. History of breast cancer     Primary Care Physician: Simone Curia, MD

## 2013-07-03 ENCOUNTER — Other Ambulatory Visit: Payer: Self-pay | Admitting: Family Medicine

## 2013-07-05 ENCOUNTER — Other Ambulatory Visit: Payer: Self-pay | Admitting: Family Medicine

## 2013-07-06 NOTE — Telephone Encounter (Signed)
I refilled this 12/8 already. This appears to be a duplicate request.  Leona Singleton, MD

## 2013-07-11 ENCOUNTER — Encounter (INDEPENDENT_AMBULATORY_CARE_PROVIDER_SITE_OTHER): Payer: PRIVATE HEALTH INSURANCE | Admitting: General Surgery

## 2013-07-24 ENCOUNTER — Encounter (INDEPENDENT_AMBULATORY_CARE_PROVIDER_SITE_OTHER): Payer: Self-pay | Admitting: General Surgery

## 2013-07-24 ENCOUNTER — Ambulatory Visit (INDEPENDENT_AMBULATORY_CARE_PROVIDER_SITE_OTHER): Payer: PRIVATE HEALTH INSURANCE | Admitting: General Surgery

## 2013-07-24 ENCOUNTER — Other Ambulatory Visit (INDEPENDENT_AMBULATORY_CARE_PROVIDER_SITE_OTHER): Payer: Self-pay

## 2013-07-24 ENCOUNTER — Encounter (INDEPENDENT_AMBULATORY_CARE_PROVIDER_SITE_OTHER): Payer: Self-pay

## 2013-07-24 VITALS — BP 116/72 | HR 72 | Temp 98.4°F | Resp 14 | Ht 66.0 in | Wt 195.6 lb

## 2013-07-24 DIAGNOSIS — Z853 Personal history of malignant neoplasm of breast: Secondary | ICD-10-CM

## 2013-07-24 DIAGNOSIS — I890 Lymphedema, not elsewhere classified: Secondary | ICD-10-CM | POA: Insufficient documentation

## 2013-07-24 DIAGNOSIS — M7989 Other specified soft tissue disorders: Secondary | ICD-10-CM

## 2013-07-24 NOTE — Patient Instructions (Signed)
We will refer you to physical therapy for lymph flow therapy.    I will see you back this summer with mammograms.

## 2013-07-24 NOTE — Assessment & Plan Note (Signed)
I think the swelling that she was having in her axilla and upper breast represent lymphedema. She did have an infection in her right arm. This triggered the increased swelling. I will refer her for lymph flow therapy

## 2013-07-24 NOTE — Assessment & Plan Note (Signed)
I will see the patient back in August. At this point she should have had her yearly mammogram.

## 2013-07-24 NOTE — Progress Notes (Signed)
HISTORY: Patient is a 52 year old female who presents with swelling and questionable nipple discharge on the right. This is the site where she underwent surgical treatment for breast cancer as well as radiation in 2010. This occurred in [place]. She denied trauma, mass, increased activity, or other issues. She had not moved or done any significant traveling.  Upon further questioning, she did have an infection on her hand and wrist on the right.   PERTINENT REVIEW OF SYSTEMS: Otherwise negative x 11  Filed Vitals:   07/24/13 1403  BP: 116/72  Pulse: 72  Temp: 98.4 F (36.9 C)  Resp: 14   Filed Weights   07/24/13 1403  Weight: 195 lb 9.6 oz (88.724 kg)    EXAM: Head: Normocephalic and atraumatic.  Eyes:  Conjunctivae are normal. Pupils are equal, round, and reactive to light. No scleral icterus.  Resp: No respiratory distress, normal effort. Breast:  Right breast smaller than left.  No palpable masses.  No nipple discharge at this time.   Neurological: Alert and oriented to person, place, and time. Coordination normal.  Skin: Skin is warm and dry. No diaphoretic. No erythema. No pallor.  she has a resolving rash between her index and third finger in the web space. She also has a rash on her lateral right wrist. Psychiatric: Normal mood and affect. Normal behavior. Judgment and thought content normal.      ASSESSMENT AND PLAN:   Lymphedema Right arm and right breast I think the swelling that she was having in her axilla and upper breast represent lymphedema. She did have an infection in her right arm. This triggered the increased swelling. I will refer her for lymph flow therapy  History of breast cancer I will see the patient back in August. At this point she should have had her yearly mammogram.      Maudry Diego, MD Surgical Oncology, General & Endocrine Surgery [facility]  Simone Curia, MD Leona Singleton, *

## 2013-08-16 ENCOUNTER — Ambulatory Visit: Payer: BC Managed Care – PPO | Attending: General Surgery | Admitting: Physical Therapy

## 2013-08-16 DIAGNOSIS — Z853 Personal history of malignant neoplasm of breast: Secondary | ICD-10-CM | POA: Insufficient documentation

## 2013-08-16 DIAGNOSIS — I890 Lymphedema, not elsewhere classified: Secondary | ICD-10-CM | POA: Insufficient documentation

## 2013-08-16 DIAGNOSIS — IMO0001 Reserved for inherently not codable concepts without codable children: Secondary | ICD-10-CM | POA: Insufficient documentation

## 2013-08-16 DIAGNOSIS — M25519 Pain in unspecified shoulder: Secondary | ICD-10-CM | POA: Insufficient documentation

## 2013-08-16 DIAGNOSIS — R079 Chest pain, unspecified: Secondary | ICD-10-CM | POA: Insufficient documentation

## 2013-08-22 ENCOUNTER — Ambulatory Visit: Payer: BC Managed Care – PPO | Admitting: Physical Therapy

## 2013-08-24 ENCOUNTER — Ambulatory Visit: Payer: BC Managed Care – PPO | Admitting: Physical Therapy

## 2013-08-29 ENCOUNTER — Ambulatory Visit: Payer: BC Managed Care – PPO | Attending: General Surgery | Admitting: Physical Therapy

## 2013-08-29 DIAGNOSIS — I890 Lymphedema, not elsewhere classified: Secondary | ICD-10-CM | POA: Insufficient documentation

## 2013-08-29 DIAGNOSIS — R079 Chest pain, unspecified: Secondary | ICD-10-CM | POA: Insufficient documentation

## 2013-08-29 DIAGNOSIS — Z853 Personal history of malignant neoplasm of breast: Secondary | ICD-10-CM | POA: Insufficient documentation

## 2013-08-29 DIAGNOSIS — IMO0001 Reserved for inherently not codable concepts without codable children: Secondary | ICD-10-CM | POA: Insufficient documentation

## 2013-08-29 DIAGNOSIS — M25519 Pain in unspecified shoulder: Secondary | ICD-10-CM | POA: Insufficient documentation

## 2013-08-31 ENCOUNTER — Ambulatory Visit: Payer: BC Managed Care – PPO | Admitting: Physical Therapy

## 2013-09-05 ENCOUNTER — Ambulatory Visit: Payer: BC Managed Care – PPO | Admitting: Physical Therapy

## 2013-09-12 ENCOUNTER — Encounter: Payer: BC Managed Care – PPO | Admitting: Physical Therapy

## 2013-09-14 ENCOUNTER — Encounter: Payer: BC Managed Care – PPO | Admitting: Physical Therapy

## 2013-09-17 ENCOUNTER — Other Ambulatory Visit: Payer: Self-pay | Admitting: Family Medicine

## 2013-12-02 ENCOUNTER — Other Ambulatory Visit: Payer: Self-pay | Admitting: Family Medicine

## 2013-12-14 ENCOUNTER — Ambulatory Visit (INDEPENDENT_AMBULATORY_CARE_PROVIDER_SITE_OTHER): Payer: BC Managed Care – PPO | Admitting: Family Medicine

## 2013-12-14 ENCOUNTER — Encounter: Payer: Self-pay | Admitting: Family Medicine

## 2013-12-14 VITALS — BP 114/79 | HR 94 | Temp 98.2°F | Wt 190.0 lb

## 2013-12-14 DIAGNOSIS — R079 Chest pain, unspecified: Secondary | ICD-10-CM

## 2013-12-14 DIAGNOSIS — E785 Hyperlipidemia, unspecified: Secondary | ICD-10-CM

## 2013-12-14 DIAGNOSIS — L905 Scar conditions and fibrosis of skin: Secondary | ICD-10-CM

## 2013-12-14 DIAGNOSIS — R0781 Pleurodynia: Secondary | ICD-10-CM

## 2013-12-14 DIAGNOSIS — I10 Essential (primary) hypertension: Secondary | ICD-10-CM

## 2013-12-14 MED ORDER — LISINOPRIL 10 MG PO TABS: 10.0000 mg | ORAL_TABLET | Freq: Every day | ORAL | Status: DC

## 2013-12-14 NOTE — Progress Notes (Signed)
Patient ID: Carol Ward, female   DOB: [birth year], 53 y.o.   MRN: [id number] Subjective:   CC: Same day for rib pain and med refill  HPI:   Rib pain Per patient, sharp rib pain present 2 weeks bilateral ribs. Pain is not severe. Denies trauma. Olive oil massage helps. Denies fevers, chills, dyspnea, chest pain, rash, skin changes, swelling, bowel or bladder changes, or incontinence. Pain does not radiate. Worse when she lays on either side and if she stretches her leg up she sometimes feels muscular tightness.  HTN Patient needs med refill on lisinopril. She takes daily. She denies chest pain or dyspnea. She denies leg swelling. She does not report any medication side effects. She is overdue for BMET.  Face nodules Pt reports left chin and neck getting nodules that she has since scraped off with tweezers. She denies fevers/chills but wants them to be removed. They previously looked like another mole she has on her left maxilla.   Review of Systems - Per HPI.  Tobacco: Never smoker     Objective:  Physical Exam BP 114/79  Pulse 94  Temp(Src) 98.2 F (36.8 C) (Oral)  Wt 190 lb (86.183 kg)  LMP 02/05/2009 GEN: NAD, pleasant, English-speaking HEENT: Atraumatic, normocephalic, neck supple, EOMI, sclera clear  CV: RRR, no murmurs, rubs, or gallops PULM: CTAB, normal effort ABD: obese, Soft, nontender, nondistended, NABS, no organomegaly; no rib erythema, deformity, or swelling. No tenderness. SKIN: Left chin and neck with 1cm hyperpigmented round flat scar with fine punctate center. Otherwise, no rash or cyanosis; warm and well-perfused EXTR: No lower extremity edema or calf tenderness PSYCH: Mood and affect euthymic, normal rate and volume of speech NEURO: Awake, alert, no focal deficits grossly, normal speech  Assessment:     Carol Ward is a 53 y.o. female with h/o HTN here for f/u of HTN and with rib pain and facial nodules.    Plan:     Rib pain Likely MSK / possibly  costochondritis. No exam abnormalities, and tenderness is minimal on exam. Abdominal exam benign.  - Reassured. - Continue massage and use ibuprofen PRN. - If continues >2 weeks or point tenderness develops, would consider imaging to eval for any mets with h/o breast cancer. - Return precautions reviewed.  HTN Well controlled on lisinopril. - BMET as future lab to get with fasting lipid panel. - Refilled 3 months lisinopril with refills.  Face nodules Likely cysts but unable to visualize due to patient picking off. Today, only scar is present.  - Do not rub face. - Use gentle soap/unscented face lotion/sunscreen. - Return when nodule returns so we can evaluate and consider removal vs derm referral.  # Health Maintenance: - Return for well woman. - lipid panel on lab only as future order. - work on wt loss  Follow-up: Follow up when convenient within 1-2 months for well woman exam and f/u facial nodules.   Hilton Sinclair, MD [place]

## 2013-12-14 NOTE — Patient Instructions (Addendum)
Great to meet you.  I have refilled lisinopril for 90 day supply. Make an appointment with me when convenient for well woman exam. Make an appointment as lab-only appt in the morning when you can come fasting and we can get your metabolic panel then too Massage your ribs, and use ibuprofen as needed with food every 4-6 hours. If you get worsened symptoms, seek immediate care. For your face, do not pick or rub and use gentle soap and sunscreen. Show this to me when you come back.

## 2013-12-15 ENCOUNTER — Other Ambulatory Visit: Payer: BC Managed Care – PPO

## 2013-12-15 DIAGNOSIS — L905 Scar conditions and fibrosis of skin: Secondary | ICD-10-CM | POA: Insufficient documentation

## 2013-12-15 DIAGNOSIS — E785 Hyperlipidemia, unspecified: Secondary | ICD-10-CM

## 2013-12-15 DIAGNOSIS — I10 Essential (primary) hypertension: Secondary | ICD-10-CM

## 2013-12-15 DIAGNOSIS — R0781 Pleurodynia: Secondary | ICD-10-CM | POA: Insufficient documentation

## 2013-12-15 NOTE — Assessment & Plan Note (Signed)
Well controlled on lisinopril. - BMET as future lab to get with fasting lipid panel. - Refilled 3 months lisinopril with refills.

## 2013-12-15 NOTE — Assessment & Plan Note (Signed)
Likely MSK / possibly costochondritis. No exam abnormalities, and tenderness is minimal on exam. Abdominal exam benign.  - Reassured. - Continue massage and use ibuprofen PRN. - If continues >2 weeks or point tenderness develops, would consider imaging to eval for any mets with h/o breast cancer. - Return precautions reviewed.

## 2013-12-15 NOTE — Assessment & Plan Note (Signed)
Likely cysts but unable to visualize due to patient picking off. Today, only scar is present.  - Do not rub face. - Use gentle soap/unscented face lotion/sunscreen. - Return when nodule returns so we can evaluate and consider removal vs derm referral.

## 2013-12-15 NOTE — Progress Notes (Signed)
BMP AND FLP DONE TODAY Carol Ward

## 2013-12-16 LAB — BASIC METABOLIC PANEL
BUN: 12 mg/dL (ref 6–23)
CO2: 28 mEq/L (ref 19–32)
Calcium: 9.2 mg/dL (ref 8.4–10.5)
Chloride: 102 mEq/L (ref 96–112)
Creat: 0.83 mg/dL (ref 0.50–1.10)
GLUCOSE: 87 mg/dL (ref 70–99)
POTASSIUM: 4.1 meq/L (ref 3.5–5.3)
Sodium: 139 mEq/L (ref 135–145)

## 2013-12-16 LAB — LIPID PANEL
CHOL/HDL RATIO: 3.9 ratio
CHOLESTEROL: 143 mg/dL (ref 0–200)
HDL: 37 mg/dL — ABNORMAL LOW (ref >39)
LDL Cholesterol: 63 mg/dL (ref 0–99)
Triglycerides: 215 mg/dL — ABNORMAL HIGH (ref <150)
VLDL: 43 mg/dL — ABNORMAL HIGH (ref 0–40)

## 2013-12-26 ENCOUNTER — Other Ambulatory Visit: Payer: Self-pay | Admitting: Family Medicine

## 2013-12-26 ENCOUNTER — Other Ambulatory Visit: Payer: Self-pay

## 2013-12-26 ENCOUNTER — Other Ambulatory Visit: Payer: Self-pay | Admitting: *Deleted

## 2013-12-26 DIAGNOSIS — Z853 Personal history of malignant neoplasm of breast: Secondary | ICD-10-CM

## 2014-01-12 ENCOUNTER — Ambulatory Visit (INDEPENDENT_AMBULATORY_CARE_PROVIDER_SITE_OTHER): Payer: BC Managed Care – PPO | Admitting: Family Medicine

## 2014-01-12 ENCOUNTER — Other Ambulatory Visit (HOSPITAL_COMMUNITY)
Admission: RE | Admit: 2014-01-12 | Discharge: 2014-01-12 | Disposition: A | Discharge status: Home / Self Care | Payer: BC Managed Care – PPO | Source: Ambulatory Visit | Attending: Family Medicine | Admitting: Family Medicine

## 2014-01-12 ENCOUNTER — Encounter: Payer: Self-pay | Admitting: Family Medicine

## 2014-01-12 VITALS — BP 127/90 | HR 76 | Temp 97.9°F | Wt 189.0 lb

## 2014-01-12 DIAGNOSIS — Z01419 Encounter for gynecological examination (general) (routine) without abnormal findings: Secondary | ICD-10-CM | POA: Insufficient documentation

## 2014-01-12 DIAGNOSIS — L819 Disorder of pigmentation, unspecified: Secondary | ICD-10-CM

## 2014-01-12 DIAGNOSIS — L905 Scar conditions and fibrosis of skin: Secondary | ICD-10-CM

## 2014-01-12 DIAGNOSIS — Z0000 Encounter for general adult medical examination without abnormal findings: Secondary | ICD-10-CM

## 2014-01-12 DIAGNOSIS — Z124 Encounter for screening for malignant neoplasm of cervix: Secondary | ICD-10-CM

## 2014-01-12 DIAGNOSIS — Z1151 Encounter for screening for human papillomavirus (HPV): Secondary | ICD-10-CM | POA: Insufficient documentation

## 2014-01-12 NOTE — Progress Notes (Signed)
Patient ID: Carol Ward, female   DOB: 31-Mar-1961, 53 y.o.   MRN: [id number] Subjective:   CC: Well woman exam  HPI:   Health maintenance visit As of 01/12/2014 : Colonoscopy: 2011 in [place]. Normal. Follow up 10 years. Mammogram: July 2014, normal. H/o breast cancer. Follows with surgery.  A1c: No FH. BP: FH HTN mother and pt. Taking med daily. Denies chest pain or other concerns. Controlled today.  STD check: All normal once in [place] and defers today. Pap smears: Has never had. No FH cervical, uterine, ovarian ca. Unsure type of breast cancer.  Lipids: Obtained last visit, 10 year ASCVD 2.5% with no need to change statin from current lovastatin. No SE. Depression screen: No symptoms. Heart sinking briefly, a while ago.  Falls risk assessment: No falls hx. Smoking status: Nonsmoker TDAP, Zostavax, pneumococcal, influenza: Had TDAP within 10 years, no zostavax.  Regular eye checkups: Checked 1 week ago, needs contacts. Dental: Has had 6 mo ago, everything ok. Brushing and flossing. Review of weight: BMI 30. No fried foods. Not a lot of carbs. Lots of boiled veg. Fruit salad. No sweets. No sweet beverages. Usually walks 1.5 miles daily, stopped a few days ago due to fasting started, will end in about 1.5 months.  LMP: Menopause. Last 2010. Chemo finished 2011. No intermittent bleeding. Review of PMH: Breast cancer 2011 lumpectomy jan 19, chemo finished sep. Follows up with oncology, last visit 1 year ago. Everything okay. Sees Dr. Barry Dienes, has appt 7/13. Review of meds: Reviewed. Husband thinks she takes dorzolamide eye drops for glaucoma.   Rib pain Improved.  Face nodules Patient has not rubbed at them. One on left chin is still burning/itching. She would like it to be checked today.     Review of Systems - Per HPI.   PMH: Saw eye doctor for glaucoma. Contacts. Saw 1 week ago. Using eye drops - unsure of name.  Smoking status: Nonsmoker; Moved here 2011     Objective:   Physical Exam BP 127/90  Pulse 76  Temp(Src) 97.9 F (36.6 C) (Oral)  Wt 189 lb (85.73 kg)  LMP 02/05/2009 GEN: NAD, pleasant. Speaks some English. CV: RRR, no m/r/g PULM: CTAB, normal effort EXTR: no swelling, 2+ bilateral DP puluses, no erythema or calf tenderness SKIN: face with left 22mm hypopig area with hair growth and mild skin thickening, no erythema, left cheek with 73mm area with irregular punctate pigmentation HEENT: AT/[place], sclera clear, EOMI, PERRL, o/p clear ABD: S/nt/nd NEURO: Awake, alert, no focal deficits, normal speech    Assessment:     Carol Ward is a 54 y.o. female with h/o breast cancer, chronic closed-angle glaucoma, and HTN here for well woman visit and f/u of skin issue of face.    Plan:     Rib pain Resolved  Face Nodules Left chin 72mm nodule appears to be an involuting nevus. Hairs from mole make likelihood of malignancy low.  - Recommended low-dose steroid (hydrocortisone 1%cream) to face daily for 1-2 weeks. - Pt would like derm referral to discuss hair removal from mole and mole itself. Referral placed.  Health maintenance As of 01/12/2014 : Colonoscopy: 2011 in [place]. Normal. Follow up 10 years (due 2021). Mammogram: July 2014, normal. H/o breast cancer. Follows with surgery. Due this July for mammogram. A1c: No FH. BP: FH HTN mother and pt. Taking med daily. Denies chest pain or other concerns. Controlled today. Continue taking lisinopril. STD check: All normal once in [place] and defers today. Pap smears:  Has never had. No FH cervical, uterine, ovarian ca. Unsure type of breast cancer. Performed today.  Lipids: Obtained last visit, 10 year ASCVD 2.5% with no need to change statin from current lovastatin. No SE. Depression screen: No symptoms. Heart sinking briefly, a while ago.  Falls risk assessment: No falls hx. Smoking status: Nonsmoker TDAP, Zostavax, pneumococcal, influenza: Had TDAP within 10 years, no zostavax. Return in October for  flu shot, defers zostavax Regular eye checkups: Checked 1 week ago, needs contacts. Dental: Has had 6 mo ago, everything ok. Brushing and flossing. Review of weight: BMI 30. No fried foods. Not a lot of carbs. Lots of boiled veg. Fruit salad. No sweets. No sweet beverages. Usually walks 1.5 miles daily, stopped a few days ago due to fasting started, will end in about 1.5 months. Return in 1.5 months to re-evaluate/discuss weight. Stay hydrated. LMP: Menopause. Last 2010. Chemo finished 2011. No intermittent bleeding. Review of PMH: Breast cancer 2011 lumpectomy jan 19, chemo finished sep. Follows up with oncology, last visit 1 year ago. Everything okay. Sees Dr. Barry Dienes, has appt 7/13. Review of meds: Reviewed. Husband thinks she takes dorzolamide eye drops for glaucoma. Bring eye drops for glaucoma at next visit.   Follow-up: Follow up in 1.5 months for f/u of weight.  Hilton Sinclair, MD [place]

## 2014-01-12 NOTE — Assessment & Plan Note (Signed)
Left chin 86mm nodule appears to be an involuting nevus. Hairs from mole make likelihood of malignancy low. Another irregular nevus left cheekbone. - Recommended low-dose steroid (hydrocortisone 1%cream) to face daily for 1-2 weeks. - Pt would like derm referral to discuss hair removal from mole and mole itself. Referral placed. - At that time or in future office visit with me, we can discuss irregular cheekbone nevus and consider removal vs watch and wait.

## 2014-01-12 NOTE — Patient Instructions (Signed)
We did a pap smear today. I will call if anything is NOT normal. Otherwise, I will send a letter. There is no need to change your cholesterol medicine for now - numbers were good. Bring eye medicines to the next visit or call with them. You can call any dermatologist you would like but I am putting in a referral in case your insurance needs this.

## 2014-01-12 NOTE — Assessment & Plan Note (Signed)
As of 01/12/2014 : Colonoscopy: 2011 in [place]. Normal. Follow up 10 years (due 2021). Mammogram: July 2014, normal. H/o breast cancer. Follows with surgery. Due this July for mammogram. A1c: No FH. BP: FH HTN mother and pt. Taking med daily. Denies chest pain or other concerns. Controlled today. Continue taking lisinopril. STD check: All normal once in [place] and defers today. Pap smears: Has never had. No FH cervical, uterine, ovarian ca. Unsure type of breast cancer. Performed today.  Lipids: Obtained last visit, 10 year ASCVD 2.5% with no need to change statin from current lovastatin. No SE. Depression screen: No symptoms. Heart sinking briefly, a while ago.  Falls risk assessment: No falls hx. Smoking status: Nonsmoker TDAP, Zostavax, pneumococcal, influenza: Had TDAP within 10 years, no zostavax. Return in October for flu shot, defers zostavax Regular eye checkups: Checked 1 week ago, needs contacts. Dental: Has had 6 mo ago, everything ok. Brushing and flossing. Review of weight: BMI 30. No fried foods. Not a lot of carbs. Lots of boiled veg. Fruit salad. No sweets. No sweet beverages. Usually walks 1.5 miles daily, stopped a few days ago due to fasting started, will end in about 1.5 months. Return in 1.5 months to re-evaluate/discuss weight. Stay hydrated. LMP: Menopause. Last 2010. Chemo finished 2011. No intermittent bleeding. Review of PMH: Breast cancer 2011 lumpectomy jan 19, chemo finished sep. Follows up with oncology, last visit 1 year ago. Everything okay. Sees Dr. Barry Dienes, has appt 7/13. Review of meds: Reviewed. Husband thinks she takes dorzolamide eye drops for glaucoma. Bring eye drops for glaucoma at next visit.

## 2014-01-16 LAB — CYTOLOGY - PAP

## 2014-01-23 ENCOUNTER — Encounter: Payer: Self-pay | Admitting: Family Medicine

## 2014-01-28 ENCOUNTER — Encounter: Payer: Self-pay | Admitting: Family Medicine

## 2014-02-05 ENCOUNTER — Ambulatory Visit
Admission: RE | Admit: 2014-02-05 | Discharge: 2014-02-05 | Disposition: A | Discharge status: Home / Self Care | Payer: BC Managed Care – PPO | Source: Ambulatory Visit | Attending: *Deleted | Admitting: *Deleted

## 2014-02-05 ENCOUNTER — Encounter: Payer: Self-pay | Admitting: Family Medicine

## 2014-02-05 ENCOUNTER — Encounter (INDEPENDENT_AMBULATORY_CARE_PROVIDER_SITE_OTHER): Payer: Self-pay

## 2014-02-05 DIAGNOSIS — Z853 Personal history of malignant neoplasm of breast: Secondary | ICD-10-CM

## 2014-05-28 ENCOUNTER — Encounter: Payer: Self-pay | Admitting: Family Medicine

## 2014-08-13 ENCOUNTER — Encounter: Payer: Self-pay | Admitting: Family Medicine

## 2014-08-13 ENCOUNTER — Ambulatory Visit (INDEPENDENT_AMBULATORY_CARE_PROVIDER_SITE_OTHER): Payer: 59 | Admitting: Family Medicine

## 2014-08-13 VITALS — BP 143/94 | HR 70 | Temp 98.6°F | Ht 66.0 in | Wt 187.0 lb

## 2014-08-13 DIAGNOSIS — R51 Headache: Secondary | ICD-10-CM

## 2014-08-13 DIAGNOSIS — R519 Headache, unspecified: Secondary | ICD-10-CM | POA: Insufficient documentation

## 2014-08-13 DIAGNOSIS — I10 Essential (primary) hypertension: Secondary | ICD-10-CM

## 2014-08-13 DIAGNOSIS — G4489 Other headache syndrome: Secondary | ICD-10-CM

## 2014-08-13 NOTE — Assessment & Plan Note (Signed)
Headaches only occur rarely, when BP high. Improves with BP medication. Associated vertigo like symptoms improve with medication from [place] ([place] - Asked pt to bring this med to f/u appt. - Continue keeping BP well controlled. - Continue regular ophthalmology visits (h/o L glaucoma), due next in May she thinks.

## 2014-08-13 NOTE — Patient Instructions (Signed)
Great to see you today.  Your BP is a little high today but that is likely because you did not take your medication this morning so I am not worried. Continue doing a great job. Use the treadmill 2-3 times weekly as a start. Continue with healthy diet. See below for general guidelines.  I am glad headaches are well controlled.  We will see you again in May.  Hilton Sinclair, MD  DASH Eating Plan DASH stands for "Dietary Approaches to Stop Hypertension." The DASH eating plan is a healthy eating plan that has been shown to reduce high blood pressure (hypertension). Additional health benefits may include reducing the risk of type 2 diabetes mellitus, heart disease, and stroke. The DASH eating plan may also help with weight loss. WHAT DO I NEED TO KNOW ABOUT THE DASH EATING PLAN? For the DASH eating plan, you will follow these general guidelines:  Choose foods with a percent daily value for sodium of less than 5% (as listed on the food label).  Use salt-free seasonings or herbs instead of table salt or sea salt.  Check with your health care provider or pharmacist before using salt substitutes.  Eat lower-sodium products, often labeled as "lower sodium" or "no salt added."  Eat fresh foods.  Eat more vegetables, fruits, and low-fat dairy products.  Choose whole grains. Look for the word "whole" as the first word in the ingredient list.  Choose fish and skinless chicken or [place] more often than red meat. Limit fish, poultry, and meat to 6 oz (170 g) each day.  Limit sweets, desserts, sugars, and sugary drinks.  Choose heart-healthy fats.  Limit cheese to 1 oz (28 g) per day.  Eat more home-cooked food and less restaurant, buffet, and fast food.  Limit fried foods.  Cook foods using methods other than frying.  Limit canned vegetables. If you do use them, rinse them well to decrease the sodium.  When eating at a restaurant, ask that your food be prepared with less salt,  or no salt if possible. WHAT FOODS CAN I EAT? Seek help from a dietitian for individual calorie needs. Grains Whole grain or whole wheat bread. Brown rice. Whole grain or whole wheat pasta. Quinoa, bulgur, and whole grain cereals. Low-sodium cereals. Corn or whole wheat flour tortillas. Whole grain cornbread. Whole grain crackers. Low-sodium crackers. Vegetables Fresh or frozen vegetables (raw, steamed, roasted, or grilled). Low-sodium or reduced-sodium tomato and vegetable juices. Low-sodium or reduced-sodium tomato sauce and paste. Low-sodium or reduced-sodium canned vegetables.  Fruits All fresh, canned (in natural juice), or frozen fruits. Meat and Other Protein Products Ground beef (85% or leaner), grass-fed beef, or beef trimmed of fat. Skinless chicken or [place]. Ground chicken or [place]. Pork trimmed of fat. All fish and seafood. Eggs. Dried beans, peas, or lentils. Unsalted nuts and seeds. Unsalted canned beans. Dairy Low-fat dairy products, such as skim or 1% milk, 2% or reduced-fat cheeses, low-fat ricotta or cottage cheese, or plain low-fat yogurt. Low-sodium or reduced-sodium cheeses. Fats and Oils Tub margarines without trans fats. Light or reduced-fat mayonnaise and salad dressings (reduced sodium). Avocado. Safflower, olive, or canola oils. Natural peanut or almond butter. Other Unsalted popcorn and pretzels. The items listed above may not be a complete list of recommended foods or beverages. Contact your dietitian for more options. WHAT FOODS ARE NOT RECOMMENDED? Grains White bread. White pasta. White rice. Refined cornbread. Bagels and croissants. Crackers that contain trans fat. Vegetables Creamed or fried vegetables. Vegetables in a  cheese sauce. Regular canned vegetables. Regular canned tomato sauce and paste. Regular tomato and vegetable juices. Fruits Dried fruits. Canned fruit in light or heavy syrup. Fruit juice. Meat and Other Protein Products Fatty cuts of meat.  Ribs, chicken wings, bacon, sausage, bologna, salami, chitterlings, fatback, hot dogs, bratwurst, and packaged luncheon meats. Salted nuts and seeds. Canned beans with salt. Dairy Whole or 2% milk, cream, half-and-half, and cream cheese. Whole-fat or sweetened yogurt. Full-fat cheeses or blue cheese. Nondairy creamers and whipped toppings. Processed cheese, cheese spreads, or cheese curds. Condiments Onion and garlic salt, seasoned salt, table salt, and sea salt. Canned and packaged gravies. Worcestershire sauce. Tartar sauce. Barbecue sauce. Teriyaki sauce. Soy sauce, including reduced sodium. Steak sauce. Fish sauce. Oyster sauce. Cocktail sauce. Horseradish. Ketchup and mustard. Meat flavorings and tenderizers. Bouillon cubes. Hot sauce. Tabasco sauce. Marinades. Taco seasonings. Relishes. Fats and Oils Butter, stick margarine, lard, shortening, ghee, and bacon fat. Coconut, palm kernel, or palm oils. Regular salad dressings. Other Pickles and olives. Salted popcorn and pretzels. The items listed above may not be a complete list of foods and beverages to avoid. Contact your dietitian for more information. WHERE CAN I FIND MORE INFORMATION? National Heart, Lung, and Blood Institute: travelstabloid.com Document Released: 07/02/2011 Document Revised: 11/27/2013 Document Reviewed: 05/17/2013 [facility] Patient Information 2015 [place], [place]. This information is not intended to replace advice given to you by your health care provider. Make sure you discuss any questions you have with your health care provider.

## 2014-08-13 NOTE — Assessment & Plan Note (Signed)
BP mildly elevated in clinic due to no med this morning; great control at home. Kidney function normal May 2015. - Continue lisinopril. If dizzy, would not do extra doze of lisinopril. - Lipids not due until next May - Increase exercise to 3x weekly on treadmill. - Healthy diet printed. - F/u May.

## 2014-08-13 NOTE — Progress Notes (Signed)
Patient ID: Carol Ward, female   DOB: 05-15-1961, 55 y.o.   MRN: [id number] Subjective:   CC: F/u HTN and headaches  HPI:   Accompanied today by husband.  F/u HTN Have not taken medicine yet this morning because was fasting. Knows it is high because feels headache and some dizziness when it is high. Home BP is mostly 110s/80s. Takes lisinopril 10mg  daily, occasional another 5mg  daily. Taking since 2005 with no issues. No new blurred vision, fainting, leg swelling, chest pain, or dyspnea. Normal urination. Exercise: None in last 2 months because states she has been lazy; has treadmill at home that she can use. Diet: Light salt. Minimal sweets. 1/2 tsp sugar in tea once daily. Craves sugar and has something sweet but not daily.  F/u headaches Only has headaches now when BP high Due to glaucoma, gets regular eye checkups. Always wears either contacts or glasses Dizzy like room spinning when gets headache; then takes medicine from home country (stamotel) which helps.   Review of Systems - Per HPI.   Smoking status: no exposure, never smoker Lives with 64 year old son and husband    Objective:  Physical Exam BP 143/94 mmHg  Pulse 70  Temp(Src) 98.6 F (37 C) (Oral)  Ht 5\' 6"  (1.676 m)  Wt 187 lb (84.823 kg)  BMI 30.20 kg/m2  LMP 02/05/2009 GEN: NAD, pleasant CV: RRR, no m/r/g, 2+ B radial pulses PULM: CTAB, normal effort ABD: S/NT/ND, still with bilateral rib pain on bilatearl sides of body EXTR: No LE edema or calf tenderness HEENT: AT/[place], sclera clear, EOMI, PERRL, left pupil s/p surgery with mild extra sheen  Assessment:     Carol Ward is a 54 y.o. female here for f/u HTN and headaches    Plan:     # See problem list and after visit summary for problem-specific plans.  # Health Maintenance: Getting flu and pna shot today  Follow-up: Follow up in May for f/u HTN and lipid check. At f/u, discuss pain in sides  Hilton Sinclair, MD [place]

## 2014-10-31 ENCOUNTER — Telehealth: Payer: Self-pay | Admitting: Family Medicine

## 2014-10-31 NOTE — Telephone Encounter (Signed)
Called [facility] to get ICD-10 codes. Patient has not been referred from [place] since 2013. Awaiting a callback from [place].

## 2014-10-31 NOTE — Telephone Encounter (Signed)
Husband says a referral is needed to be sent to [facility]  [place]: [facility] Referral was requested by [facility] and has not received Her appt is April 18

## 2014-10-31 NOTE — Telephone Encounter (Signed)
Will forward to [place]. Jalyah Weinheimer,CMA

## 2014-11-13 ENCOUNTER — Telehealth: Payer: Self-pay | Admitting: *Deleted

## 2014-11-13 DIAGNOSIS — I890 Lymphedema, not elsewhere classified: Secondary | ICD-10-CM

## 2014-11-13 NOTE — Telephone Encounter (Signed)
Referral form and insurance card faxed to [facility].  Will wait until we hear back from ortho before placing referral online with [facility].  LM for jennifer to call back.  We need her to fax notes to them at [phone]. Jazmin Hartsell,CMA

## 2014-11-13 NOTE — Telephone Encounter (Signed)
Referral placed. Blue team please call Anderson Malta at number below. Thanks,  Hilton Sinclair, MD

## 2014-11-13 NOTE — Telephone Encounter (Signed)
Will forward to Md to place referral.  Patient has united healthcare compass and this requires referral from pcp. Annastacia Duba,CMA

## 2014-11-13 NOTE — Telephone Encounter (Signed)
Anderson Malta from [facility] called needing a referral placed to [facility].  Pt has lymphedema of Left arm (history or breast cancer).  Referral needed ASAP.  Please give Anderson Malta a call when the appt has been made at [phone].  Derl Barrow, RN

## 2014-11-23 ENCOUNTER — Telehealth: Payer: Self-pay | Admitting: Family Medicine

## 2014-11-23 NOTE — Telephone Encounter (Signed)
Pt's husband requested referral be sent to correct orthopedic practice.  Want to have wife go to [facility] instead of [facility].

## 2014-11-23 NOTE — Telephone Encounter (Signed)
Spoke with husband and informed him that we did fax the referral to [facility] by mistake, but the same day faxed it to [facility] on [address].  He voiced understanding and states that he hasn't heard from them.  He was advised to contact their office to speak with them regarding an appt.  Jazmin Hartsell,CMA

## 2014-11-30 ENCOUNTER — Telehealth: Payer: Self-pay | Admitting: Family Medicine

## 2014-11-30 NOTE — Telephone Encounter (Signed)
[facility] called because we did a referral for the patient to be see there. The diagnoses that the patient has is not something they see patients for. If you have any questions please call them at [phone]. jw

## 2014-11-30 NOTE — Telephone Encounter (Signed)
Spoke with Madison from [facility] and they need PT orders faxed [phone]) to them for 3 times a week for 4 weeks.  It will have to be signed by Dr. Andria Frames since he is the pcp on her united healthcare insurance card.  I also advised Madison to speak with Anderson Malta at [facility] since they are requesting this referral.  She voiced understanding and I informed her that I would ask about the order. Jazmin Hartsell,CMA

## 2014-11-30 NOTE — Telephone Encounter (Signed)
Will forward to referral coordinator. Dayzee Trower,CMA  

## 2014-12-03 ENCOUNTER — Telehealth: Payer: Self-pay | Admitting: Family Medicine

## 2014-12-03 DIAGNOSIS — H538 Other visual disturbances: Secondary | ICD-10-CM

## 2014-12-03 NOTE — Telephone Encounter (Signed)
Calling because patient needs a referral to see Dr. Edilia Bo with [facility]; address: [address] [place], [place] [address]. Phone: [phone]. Thanks, [employer], ASA

## 2014-12-05 NOTE — Telephone Encounter (Signed)
Referral placed to ophthalmology per pt request for h/o glaucoma and blurred vision per problem list.  Carol Sinclair, MD

## 2014-12-29 ENCOUNTER — Other Ambulatory Visit: Payer: Self-pay | Admitting: Family Medicine

## 2014-12-31 ENCOUNTER — Other Ambulatory Visit: Payer: Self-pay | Admitting: Family Medicine

## 2014-12-31 DIAGNOSIS — Z853 Personal history of malignant neoplasm of breast: Secondary | ICD-10-CM

## 2015-01-01 ENCOUNTER — Other Ambulatory Visit: Payer: Self-pay | Admitting: Family Medicine

## 2015-01-02 MED ORDER — LOVASTATIN 40 MG PO TABS: 40.0000 mg | ORAL_TABLET | Freq: Every day | ORAL | Status: DC

## 2015-01-02 NOTE — Telephone Encounter (Addendum)
Just filled on 6/6, but resent refill request possibly so that we could change to 40mg  tablet. Therefore, will also change to 90 tabs with 3 refills.  Hilton Sinclair, MD

## 2015-01-02 NOTE — Addendum Note (Signed)
Addended by: Conni Slipper T on: 01/02/2015 02:08 PM   Modules accepted: Orders, Medications

## 2015-02-04 ENCOUNTER — Ambulatory Visit (INDEPENDENT_AMBULATORY_CARE_PROVIDER_SITE_OTHER): Payer: 59 | Admitting: Internal Medicine

## 2015-02-04 ENCOUNTER — Encounter: Payer: Self-pay | Admitting: Internal Medicine

## 2015-02-04 VITALS — BP 119/83 | HR 79 | Temp 98.2°F | Ht 66.0 in | Wt 189.0 lb

## 2015-02-04 DIAGNOSIS — E785 Hyperlipidemia, unspecified: Secondary | ICD-10-CM

## 2015-02-04 DIAGNOSIS — Z01419 Encounter for gynecological examination (general) (routine) without abnormal findings: Secondary | ICD-10-CM

## 2015-02-04 DIAGNOSIS — Z0000 Encounter for general adult medical examination without abnormal findings: Secondary | ICD-10-CM

## 2015-02-04 DIAGNOSIS — M25562 Pain in left knee: Secondary | ICD-10-CM

## 2015-02-04 DIAGNOSIS — I10 Essential (primary) hypertension: Secondary | ICD-10-CM

## 2015-02-04 LAB — LIPID PANEL
CHOL/HDL RATIO: 4.4 ratio
Cholesterol: 162 mg/dL (ref 0–200)
HDL: 37 mg/dL — ABNORMAL LOW (ref >=46)
LDL CALC: 59 mg/dL (ref 0–99)
TRIGLYCERIDES: 329 mg/dL — AB (ref <150)
VLDL: 66 mg/dL — ABNORMAL HIGH (ref 0–40)

## 2015-02-04 LAB — POCT GLYCOSYLATED HEMOGLOBIN (HGB A1C): Hemoglobin A1C: 5.8

## 2015-02-04 MED ORDER — ZOLPIDEM TARTRATE 5 MG PO TABS: 5.0000 mg | ORAL_TABLET | Freq: Every evening | ORAL | Status: DC | PRN

## 2015-02-04 MED ORDER — DICLOFENAC SODIUM 1 % TD GEL: 2.0000 g | Freq: Four times a day (QID) | TRANSDERMAL | Status: DC

## 2015-02-04 MED ORDER — LISINOPRIL 10 MG PO TABS: 10.0000 mg | ORAL_TABLET | Freq: Every day | ORAL | Status: DC

## 2015-02-04 MED ORDER — LOVASTATIN 40 MG PO TABS: 40.0000 mg | ORAL_TABLET | Freq: Every day | ORAL | Status: DC

## 2015-02-04 NOTE — Assessment & Plan Note (Signed)
Pt taking Lovastatin daily. Last Lipid Panel on 12/15/2013: cholesterol 143, TGs 215, HDL 37, LDL 63. -Will refill Lovastatin today -Will order Lipid Panel today as part of her well woman exam

## 2015-02-04 NOTE — Assessment & Plan Note (Signed)
BP well controlled. Pt checks BP had home 2-3 times per day. Systolics ranging from 578X-784R. Usually below 120s.  -Will refill Lisinopril -Pt encouraged to continue to walk for 30-45 minutes each day -Follow-up as needed

## 2015-02-04 NOTE — Patient Instructions (Signed)
It was nice to meet you! Thanks for coming into clinic today.  I have re-ordered your Lisinopril, Lovastatin, and Zolpidem. I have also prescribed an anti-inflammatory gel (Voltaren gel) for your knee pain. You may use this up to 4 times per day. You may also try applying ice to your knee.  Please come back to see [place] with any new concerns.  -Dr. Brett Albino

## 2015-02-04 NOTE — Progress Notes (Signed)
   Subjective:    Patient ID: Carol Ward, female    DOB: 1961-05-14, 54 y.o.   MRN: [id number]  HPI  As of 02/04/15: Concerns today: L knee pain and swelling that occurs occasionally over the last few years, but worsened in the last 3-4 days. She couldn't sleep last night because of the pain. She has been trying to massage her knee with olive oil, which has helped. The pain is worse after walking. She denies any trauma or previous injury to the knee. She denies any locking, popping, clicking, or giving out of her knee. Colonoscopy: 2011; normal results Mammogram: last mammogram on 02/05/2014, showed benign postoperative appearance; next mammogram scheduled for 02/08/2015 Sexually active: Yes, only with husband. LMP: 2010; has not had any vaginal bleeding since then  STD check: Offered STD testing as part of a yearly physical, Pt declined at this time Pap smears: Last Pap 01/12/2014; normal results and no HPV detected Pelvic symptoms: Denies Lipids: Last Lipid Panel on 12/15/2013: Cholesterol 143, TGs 215, HDL 37, LDL 63 Smoking status: Never smoker Alcohol: None Drugs: None Depression screen: Negative for depressive symptoms Falls risk assessment: No history of falls TDAP, Zostavax, pneumococcal, influenza: Last TDAP in 2010, declines Zostavax and pneumococcal; would like to come back for influenza vaccine in the fall Regular eye checkups: Yes, has appt scheduled for 02/11/15 Dental: Sees a dentist every year Exercise: Walks 30-45 minutes each day Diet: Breakfast- eggs and toast; lunch- salad and fruit; dinner- pita with chicken BP: 119/83 today; checks BP 2-3 times per day at home; systolics normally range from 100s-140s; denies chest pain; has occasional headaches with BP is in the 140s A1c: Has never been done Review of PMH: Hx of breast cancer followed by surgery, HTN, HLD, acute glaucoma followed ophthamology Review of meds: Taking all meds daily. No side effects or concerns.   Review  of Systems  All other systems reviewed and are negative.      Objective:   Physical Exam  Constitutional: She is oriented to person, place, and time. She appears well-developed and well-nourished. No distress.  HENT:  Head: Normocephalic and atraumatic.  Nose: Nose normal.  Mouth/Throat: Oropharynx is clear and moist.  Eyes: Conjunctivae and EOM are normal. No scleral icterus.  Neck: Normal range of motion. Neck supple.  Cardiovascular: Normal rate and regular rhythm.  Exam reveals no gallop and no friction rub.   No murmur heard. Pulmonary/Chest: Effort normal and breath sounds normal. She has no wheezes. She has no rales.  Abdominal: Soft. Bowel sounds are normal. She exhibits no distension. There is no tenderness.  Musculoskeletal: Normal range of motion.       Right knee: Normal. She exhibits no swelling.       Left knee: She exhibits swelling. She exhibits normal range of motion, no effusion, no deformity and no erythema. Tenderness found. Medial joint line and lateral joint line tenderness noted.  Lymphadenopathy:    She has no cervical adenopathy.  Neurological: She is alert and oriented to person, place, and time.  Skin: Skin is warm and dry. No rash noted.  Psychiatric: She has a normal mood and affect. Her behavior is normal.          Assessment & Plan:

## 2015-02-04 NOTE — Assessment & Plan Note (Signed)
Colonoscopy: 2011; normal results; next colonoscopy due in 2021 Mammogram: last mammogram on 02/05/2014, showed benign postoperative appearance; next mammogram scheduled for 02/08/2015 Sexually active: Yes, only with husband. LMP: 2010; has not had any vaginal bleeding since then  STD check: Offered STD testing as part of a yearly physical, Pt declined at this time Pap smears: Last Pap 01/12/2014; normal results and no HPV detected; next Pap due in 2020 Pelvic symptoms: Denies Lipids: Last Lipid Panel on 12/15/2013: Cholesterol 143, TGs 215, HDL 37, LDL 63; Fasting Lipid Panel ordered today Smoking status: Never smoker Alcohol: None Drugs: None Depression screen: Negative for depressive symptoms Falls risk assessment: No history of falls TDAP, Zostavax, pneumococcal, influenza: Last TDAP in 2010, declines Zostavax and pneumococcal; Pt will come back this fall for influenza vaccine Regular eye checkups: Yes, has appt scheduled for 02/11/15 Dental: Sees a dentist every year Exercise: Walks 30-45 minutes each day Diet: Breakfast- eggs and toast; lunch- salad and fruit; dinner- pita with chicken BP: 119/83 today; checks BP 2-3 times per day at home; systolics normally range from 100s-140s; denies chest pain; has occasional headaches with BP is in the 140s; Will refill Lisinopril A1c: Has never been done; Will order A1c today, as Pt is overweight and has a DM risk factor (HTN).

## 2015-02-04 NOTE — Assessment & Plan Note (Signed)
Achy pain and swelling in L knee for a couple years, but has worsened in the last 3-4 days. Worse after walking. No trauma or falls. No effusion, erythema, or instability noted on exam. Pt likely has osteoarthritis of the L knee.  -Advised Pt to ice and elevate knee after walking -Will prescribe Voltaren gel, as Pt would not like to take oral anti-inflammatory agents at this time -Pt to follow-up as needed

## 2015-02-05 ENCOUNTER — Encounter: Payer: Self-pay | Admitting: Internal Medicine

## 2015-02-07 ENCOUNTER — Other Ambulatory Visit: Payer: Self-pay | Admitting: Family Medicine

## 2015-02-07 DIAGNOSIS — Z853 Personal history of malignant neoplasm of breast: Secondary | ICD-10-CM

## 2015-02-08 ENCOUNTER — Other Ambulatory Visit: Payer: Self-pay | Admitting: Family Medicine

## 2015-02-08 ENCOUNTER — Ambulatory Visit
Admission: RE | Admit: 2015-02-08 | Discharge: 2015-02-08 | Disposition: A | Discharge status: Home / Self Care | Payer: 59 | Source: Ambulatory Visit | Attending: Family Medicine | Admitting: Family Medicine

## 2015-02-08 ENCOUNTER — Encounter: Payer: Self-pay | Admitting: Internal Medicine

## 2015-02-08 DIAGNOSIS — Z853 Personal history of malignant neoplasm of breast: Secondary | ICD-10-CM

## 2015-02-11 ENCOUNTER — Telehealth: Payer: Self-pay | Admitting: Internal Medicine

## 2015-02-11 DIAGNOSIS — H538 Other visual disturbances: Secondary | ICD-10-CM

## 2015-02-11 NOTE — Telephone Encounter (Signed)
Husband called and his wife needs another referral to see the eye doctor. jw

## 2015-02-11 NOTE — Telephone Encounter (Signed)
I have sent in her referral. I will call her and let her know. Thank you.

## 2015-04-13 ENCOUNTER — Other Ambulatory Visit: Payer: Self-pay | Admitting: Family Medicine

## 2015-04-18 NOTE — Telephone Encounter (Signed)
Pt is checking status of this medication, was sent to a provider that is no longer here.

## 2015-04-18 NOTE — Telephone Encounter (Signed)
Please let Ms. Pidgeon know that I have sent in a prescription for her Lovastatin (Mevacor). Thank you!

## 2015-05-10 ENCOUNTER — Encounter: Payer: Self-pay | Admitting: Family Medicine

## 2015-05-10 ENCOUNTER — Ambulatory Visit (INDEPENDENT_AMBULATORY_CARE_PROVIDER_SITE_OTHER): Payer: 59 | Admitting: Family Medicine

## 2015-05-10 ENCOUNTER — Telehealth: Payer: Self-pay | Admitting: Internal Medicine

## 2015-05-10 VITALS — BP 116/80 | HR 91 | Temp 98.1°F | Wt 192.7 lb

## 2015-05-10 DIAGNOSIS — H409 Unspecified glaucoma: Secondary | ICD-10-CM | POA: Diagnosis not present

## 2015-05-10 DIAGNOSIS — M25562 Pain in left knee: Secondary | ICD-10-CM

## 2015-05-10 MED ORDER — DICLOFENAC SODIUM 1 % TD GEL: 2.0000 g | Freq: Four times a day (QID) | TRANSDERMAL | Status: DC

## 2015-05-10 NOTE — Progress Notes (Signed)
    Subjective: CC: L knee pain/ glaucoma HPI: Patient is a 54 y.o. female presenting to clinic today for same day appt. Concerns today include:  1. Left knee pain Reports that it is located on the lateral aspect of her L knee.  She notes that it started about 6 months ago.  She notes that when she stands for a while, pain is worse.  She notes that the Voltaren gel works well but she is concerned that it is simply a temporary fix.  Patient denies numbness, tingling, weakness, falls.  No injury to area.  2. Glaucoma Patient sees ophthalmologist regularly.  She has an appt on Monday 10/17.  She needs a referral for this appt.  She notes that she is compliant with Xalatan and Timolol eye gtts.  She has had glaucoma for several years.  No acute issues, symptoms are stable.  Social History Reviewed: non smoker. FamHx and MedHx updated.  Please see EMR. Health Maintenance: Flu shot due  ROS: Per HPI  Objective: Office vital signs reviewed. BP 116/80 mmHg  Pulse 91  Temp(Src) 98.1 F (36.7 C) (Oral)  Wt 192 lb 11.2 oz (87.408 kg)  LMP 02/05/2009  Physical Examination:  General: Awake, alert, well nourished, NAD HEENT: Normal    Eyes: EOMI Extremities: WWP, No edema, cyanosis or clubbing; +2 PT pulses bilaterally  L knee: no joint line TTP, no effusion, no erythema, negative for ACL, MCL, PCL, LCL laxity, negative McMurray's, negative FADIR and negative FABER.  MSK: Normal gait and station Skin: dry, intact, no rashes or lesions Neuro: Strength and sensation grossly intact, patellar DTRs 2/4  Assessment/ Plan: 54 y.o. female with  1. Left knee pain.  Suspect OA of the knee.  Voltaren relieves this well.  Patient would like to have PT.  Declines intraarticular injection at this time.  - Ambulatory referral to Physical Therapy - diclofenac sodium (VOLTAREN) 1 % GEL; Apply 2 g topically 4 (four) times daily.  Dispense: 100 g; Refill: 4 - Return precautions reviewed - Follow up with  PCP as needed  2. Glaucoma - Stable. - Ambulatory referral to Ophthalmology - Continue eye drops. - Follow up with PCP as scheduled for routine care.   Janora Norlander, DO PGY-2, [place]

## 2015-05-10 NOTE — Telephone Encounter (Signed)
Pt husband is here and needs a [facility] referral completed for this pt to keep her appt on Monday 05/13/2015 with opthalmology provider, Dr. Daron Offer, located at  [address], [place], [place] [address]; [phone]. He tells me that in November she will see her regular provider there and another referral will need to be placed for that provider. That provider's name is Dr. Raynelle Fanning. Thank you, Fonda Kinder, ASA

## 2015-05-10 NOTE — Telephone Encounter (Signed)
Will forward to MD to place referral. Jazmin Hartsell,CMA  

## 2015-05-10 NOTE — Patient Instructions (Signed)
A referral has been placed to Physical therapy and your ophthalmologist.  A refill of the gel has also been sent into your pharmacy.  Osteoarthritis Osteoarthritis is a disease that causes soreness and inflammation of a joint. It occurs when the cartilage at the affected joint wears down. Cartilage acts as a cushion, covering the ends of bones where they meet to form a joint. Osteoarthritis is the most common form of arthritis. It often occurs in older people. The joints affected most often by this condition include those in the:  Ends of the fingers.  Thumbs.  Neck.  Lower back.  Knees.  Hips. CAUSES  Over time, the cartilage that covers the ends of bones begins to wear away. This causes bone to rub on bone, producing pain and stiffness in the affected joints.  RISK FACTORS Certain factors can increase your chances of having osteoarthritis, including:  Older age.  Excessive body weight.  Overuse of joints.  Previous joint injury. SIGNS AND SYMPTOMS   Pain, swelling, and stiffness in the joint.  Over time, the joint may lose its normal shape.  Small deposits of bone (osteophytes) may grow on the edges of the joint.  Bits of bone or cartilage can break off and float inside the joint space. This may cause more pain and damage. DIAGNOSIS  Your health care provider will do a physical exam and ask about your symptoms. Various tests may be ordered, such as:  X-rays of the affected joint.  Blood tests to rule out other types of arthritis. Additional tests may be used to diagnose your condition. TREATMENT  Goals of treatment are to control pain and improve joint function. Treatment plans may include:  A prescribed exercise program that allows for rest and joint relief.  A weight control plan.  Pain relief techniques, such as:  Properly applied heat and cold.  Electric pulses delivered to nerve endings under the skin (transcutaneous electrical nerve stimulation  [TENS]).  Massage.  Certain nutritional supplements.  Medicines to control pain, such as:  Acetaminophen.  Nonsteroidal anti-inflammatory drugs (NSAIDs), such as naproxen.  Narcotic or central-acting agents, such as tramadol.  Corticosteroids. These can be given orally or as an injection.  Surgery to reposition the bones and relieve pain (osteotomy) or to remove loose pieces of bone and cartilage. Joint replacement may be needed in advanced states of osteoarthritis. HOME CARE INSTRUCTIONS   Take medicines only as directed by your health care provider.  Maintain a healthy weight. Follow your health care provider's instructions for weight control. This may include dietary instructions.  Exercise as directed. Your health care provider can recommend specific types of exercise. These may include:  Strengthening exercises. These are done to strengthen the muscles that support joints affected by arthritis. They can be performed with weights or with exercise bands to add resistance.  Aerobic activities. These are exercises, such as brisk walking or low-impact aerobics, that get your heart pumping.  Range-of-motion activities. These keep your joints limber.  Balance and agility exercises. These help you maintain daily living skills.  Rest your affected joints as directed by your health care provider.  Keep all follow-up visits as directed by your health care provider. SEEK MEDICAL CARE IF:   Your skin turns red.  You develop a rash in addition to your joint pain.  You have worsening joint pain.  You have a fever along with joint or muscle aches. SEEK IMMEDIATE MEDICAL CARE IF:  You have a significant loss of weight or  appetite.  You have night sweats. [place] of Arthritis and Musculoskeletal and Skin Diseases: www.niams.SouthExposed.es  [employer] on Aging: http://kim-miller.com/  American College of Rheumatology: www.rheumatology.org   This  information is not intended to replace advice given to you by your health care provider. Make sure you discuss any questions you have with your health care provider.   Document Released: 07/13/2005 Document Revised: 08/03/2014 Document Reviewed: 03/20/2013 Elsevier Interactive Patient Education [employer].

## 2015-05-10 NOTE — Telephone Encounter (Signed)
[facility] referral # [id number] -- Dr. Daron Offer  [facility] referral # [id number] -- Dr. Edilia Bo

## 2015-05-13 NOTE — Telephone Encounter (Signed)
[place] referral # [id number] -- Dr. Dwana Melena. Scheduled 05/21/15

## 2015-07-11 ENCOUNTER — Telehealth: Payer: Self-pay | Admitting: *Deleted

## 2015-07-11 MED ORDER — ZOLPIDEM TARTRATE 5 MG PO TABS: 5.0000 mg | ORAL_TABLET | Freq: Every evening | ORAL | Status: DC | PRN

## 2015-07-15 NOTE — Telephone Encounter (Signed)
Patient called inquiring about [place] script.  We checked both up front in the box and in the faxed folder.  Will forward to MD to see where script was placed once it was printed. Jazmin Hartsell,CMA

## 2015-07-16 MED ORDER — ZOLPIDEM TARTRATE 5 MG PO TABS: 5.0000 mg | ORAL_TABLET | Freq: Every evening | ORAL | Status: DC | PRN

## 2015-07-16 NOTE — Telephone Encounter (Signed)
Please let Ms. Oldfather know that I have signed her prescription and placed it up front in the box. She can pick it up today. Thank you!

## 2015-07-16 NOTE — Telephone Encounter (Signed)
Spoke with husband and he will come pick up script. Jazmin Hartsell,CMA

## 2015-07-16 NOTE — Addendum Note (Signed)
Addended by: Hyman Bible D on: 07/16/2015 08:12 AM   Modules accepted: Orders

## 2015-07-16 NOTE — Addendum Note (Signed)
Addended by: Hyman Bible D on: 07/16/2015 08:09 AM   Modules accepted: Orders

## 2015-08-20 ENCOUNTER — Encounter: Payer: Self-pay | Admitting: Internal Medicine

## 2015-08-20 ENCOUNTER — Ambulatory Visit (INDEPENDENT_AMBULATORY_CARE_PROVIDER_SITE_OTHER): Payer: BLUE CROSS/BLUE SHIELD | Admitting: Internal Medicine

## 2015-08-20 VITALS — BP 130/77 | HR 89 | Temp 98.2°F | Resp 18 | Ht 66.0 in | Wt 187.0 lb

## 2015-08-20 DIAGNOSIS — I10 Essential (primary) hypertension: Secondary | ICD-10-CM | POA: Diagnosis not present

## 2015-08-20 MED ORDER — LISINOPRIL 10 MG PO TABS: 15.0000 mg | ORAL_TABLET | Freq: Every day | ORAL | Status: DC

## 2015-08-20 NOTE — Patient Instructions (Signed)
It was so nice to meet you again!  Please take the Lisinopril 15mg  (1.5 tablets) every morning. Please continue to check your blood pressures regularly.  I hope you have a great trip!  -Dr. Brett Albino

## 2015-08-20 NOTE — Progress Notes (Signed)
Patient is here for FU Hypertension  Patient states for the past 15 days her BP has been running high. Patient suffers from HA's when her pressure is high and low. Patient also states when her pressure is high she has pain in her right leg and arm. Patient monitors her BP at home.  Patient states her husband and she will be traveling back to their home town ([place]) next month for a family wedding. They will be gone for a few weeks.  Patient complains of pain in her ankles and feet scaled currently at a 5. Patient states she massages and takes pain medication to relieve the pain.

## 2015-08-20 NOTE — Progress Notes (Signed)
   [facility] Phone: [phone]  Subjective:  Hypertension: She has been having elevated blood pressures up to 154/94 over the last few weeks. Her blood pressures have mostly been in the 140s. She can feel her blood pressure is high when she has a headache. She sometimes takes an additional 1/2 tablet of Lisinopril at night when she is having a headache. After she takes it, her headache goes away. She will recheck her blood pressure after taking the extra 1/2 tablet and her blood pressure has decreased to the 130s. She denies any chest pain, shortness of breath, or lower extremity edema.  All other ROS were reviewed and are negative unless otherwise noted in the HPI. Past Medical History- HTN, HLD Reviewed problem list.  Medications- reviewed and updated Current Outpatient Prescriptions  Medication Sig Dispense Refill  . diclofenac sodium (VOLTAREN) 1 % GEL Apply 2 g topically 4 (four) times daily. 100 g 4  . dorzolamide-timolol (COSOPT) 22.3-6.8 MG/ML ophthalmic solution     . latanoprost (XALATAN) 0.005 % ophthalmic solution     . lisinopril (PRINIVIL,ZESTRIL) 10 MG tablet Take 1 tablet (10 mg total) by mouth daily. 90 tablet 3  . lovastatin (MEVACOR) 20 MG tablet TAKE TWO TABLETS BY MOUTH AT BEDTIME. NEED OFFICE VISIT 180 tablet 0  . lovastatin (MEVACOR) 40 MG tablet Take 1 tablet (40 mg total) by mouth at bedtime. 90 tablet 3  . zolpidem (AMBIEN) 5 MG tablet Take 1 tablet (5 mg total) by mouth at bedtime as needed for sleep. 30 tablet 1   No current facility-administered medications for this visit.   Chief complaint-noted Family history reviewed for today's visit. No changes. Social history- patient is a never smoker  Objective: BP 130/77 mmHg  Pulse 89  Temp(Src) 98.2 F (36.8 C) (Oral)  Resp 18  Ht 5\' 6"  (1.676 m)  Wt 187 lb (84.823 kg)  BMI 30.20 kg/m2  SpO2 96%  LMP 02/05/2009 Gen: NAD, alert, cooperative with exam HEENT: NCAT, EOMI, MMM Neck:  FROM, supple CV: RRR, good S1/S2, no murmur Resp: CTABL, no wheezes, normal work of breathing GI: SNTND, BS present, no guarding or organomegaly Msk: No edema, warm, normal tone, moves UE/LE spontaneously Neuro: Alert and oriented, no gross deficits Skin: no rashes, no lesions  Psych: Appropriate behavior  Assessment/Plan: See problem based a/p   Hyman Bible, MD PGY-1

## 2015-08-20 NOTE — Assessment & Plan Note (Signed)
BP 130/77 in clinic today. She states she checks her BP twice a day at home and they have been running in the 140s-150s. She feels much better when she takes an extra 1/2 tablet of Lisinopril at night. - Will increase Lisinopril dose from 10mg  daily to 15mg  daily - Encouraged Pt to continue to check blood pressures at home regularly - Follow-up in 6 months.

## 2015-09-30 ENCOUNTER — Other Ambulatory Visit: Payer: Self-pay | Admitting: *Deleted

## 2015-10-01 MED ORDER — LOVASTATIN 20 MG PO TABS: 40.0000 mg | ORAL_TABLET | Freq: Every day | ORAL | Status: DC

## 2015-10-01 MED ORDER — ZOLPIDEM TARTRATE 5 MG PO TABS
5.0000 mg | ORAL_TABLET | Freq: Every evening | ORAL | Status: DC | PRN
Start: 2015-10-01 — End: 2015-11-29

## 2015-10-01 NOTE — Telephone Encounter (Signed)
LM for patient that script is ready for pick up. Rosa Wyly,CMA  

## 2015-10-02 ENCOUNTER — Other Ambulatory Visit: Payer: Self-pay | Admitting: Internal Medicine

## 2015-11-29 ENCOUNTER — Other Ambulatory Visit: Payer: Self-pay | Admitting: *Deleted

## 2015-11-29 ENCOUNTER — Encounter: Payer: Self-pay | Admitting: Internal Medicine

## 2015-11-29 ENCOUNTER — Ambulatory Visit (INDEPENDENT_AMBULATORY_CARE_PROVIDER_SITE_OTHER): Payer: BLUE CROSS/BLUE SHIELD | Admitting: Internal Medicine

## 2015-11-29 VITALS — BP 117/76 | HR 100 | Temp 98.1°F | Ht 66.0 in | Wt 182.0 lb

## 2015-11-29 DIAGNOSIS — M25511 Pain in right shoulder: Secondary | ICD-10-CM

## 2015-11-29 MED ORDER — ZOLPIDEM TARTRATE 5 MG PO TABS: 5.0000 mg | ORAL_TABLET | Freq: Every evening | ORAL | Status: DC | PRN

## 2015-11-29 NOTE — Patient Instructions (Addendum)
It was so nice to see you!  I think your shoulder and neck pain is related to tight tendons in your shoulder. I would recommend applying heat to that area and stretching 2-3 times a day. You can also try to apply some Voltaren gel to that area. If your pain is not better in the next week with heat, stretching, and Voltaren gel, please give me a call.  Please come back in 3 months for follow-up of your blood pressure and cholesterol.  -Dr. Brett Albino

## 2015-11-29 NOTE — Progress Notes (Signed)
   [facility] Phone: [phone]  Subjective:  Right Shoulder Pain: She started having right shoulder pain 1 week ago. The pain feels like a "burning". The shoulder was hurting constantly last week and is now hurting intermittently. The pain is not worse with movement or lifting. Tylenol and Voltaren gel help. No injury or trauma to the shoulder. She is having some associated ring finger numbness. No swelling or redness of the shoulder joint. She is doing an olive oil massage every day, which helps. No fevers, no chills, no night sweats, no unintentional weight loss.   ROS: See HPI for pertinent positives and negatives Past Medical History- hx of lumpectomy of the right breast, HTN, HLD Reviewed problem list.  Medications- reviewed and updated Current Outpatient Prescriptions  Medication Sig Dispense Refill  . diclofenac sodium (VOLTAREN) 1 % GEL Apply 2 g topically 4 (four) times daily. 100 g 4  . dorzolamide-timolol (COSOPT) 22.3-6.8 MG/ML ophthalmic solution     . latanoprost (XALATAN) 0.005 % ophthalmic solution     . lisinopril (PRINIVIL,ZESTRIL) 10 MG tablet Take 1.5 tablets (15 mg total) by mouth daily. 90 tablet 3  . lovastatin (MEVACOR) 20 MG tablet TAKE 2 TABLETS (40 MG TOTAL) BY MOUTH AT BEDTIME. 60 tablet 0  . lovastatin (MEVACOR) 40 MG tablet Take 1 tablet (40 mg total) by mouth at bedtime. 90 tablet 3  . zolpidem (AMBIEN) 5 MG tablet Take 1 tablet (5 mg total) by mouth at bedtime as needed for sleep. 30 tablet 0   No current facility-administered medications for this visit.   Chief complaint-noted Family history reviewed for today's visit. No changes. Social history- patient is a never smoker  Objective: BP 117/76 mmHg  Pulse 100  Temp(Src) 98.1 F (36.7 C) (Oral)  Ht 5\' 6"  (1.676 m)  Wt 182 lb (82.555 kg)  BMI 29.39 kg/m2  LMP 02/05/2009 Gen: NAD, alert, cooperative with exam HEENT: NCAT, EOMI, MMM Neck: FROM, supple Resp: Normal work of  breathing Msk: No edema of lower extremities  Right shoulder: No gross deformity, no erythema, no edema. Full ROM. Right trapezius and right-sided neck tendons are tense and tender to palpation. Normal strength in right upper extremity. Negative empty can test, negative Beer's, negative Hawkin's. Neuro: Alert and oriented, no gross deficits Skin: No rashes, no lesions Psych: Appropriate behavior  Assessment/Plan: Right shoulder pain: Not associated with any trauma and not associated with movement. Right trapezius muscle and right-sided neck tendons are tight and tender to palpation. May be secondary to poor posture, as Pt sits slumped over. - Recommend heat and stretching 2-3 times per day. Pt given 3 stretching exercises. Exercises were demonstrated in the room - Can use Tylenol and Voltaren as needed for pain. Pt has Voltaren gel for knee osteoarthritis. - Continue olive oil massages since they are helping - Pt advised to call in 1-2 weeks if shoulder/neck pain is not improved.   Hyman Bible, MD PGY-1

## 2015-11-29 NOTE — Telephone Encounter (Signed)
Patient has an appt today. Stephenia Vogan,CMA

## 2015-11-29 NOTE — Assessment & Plan Note (Signed)
Not associated with any trauma and not associated with movement. Right trapezius muscle and right-sided neck tendons are tight and tender to palpation. May be secondary to poor posture, as Pt sits slumped over. - Recommend heat and stretching 2-3 times per day. Pt given 3 stretching exercises. Exercises were demonstrated in the room - Can use Tylenol and Voltaren as needed for pain. Pt has Voltaren gel for knee osteoarthritis. - Continue olive oil massages since they are helping - Pt advised to call in 1-2 weeks if shoulder/neck pain is not improved.

## 2016-01-27 ENCOUNTER — Other Ambulatory Visit: Payer: Self-pay | Admitting: Family Medicine

## 2016-01-27 DIAGNOSIS — Z853 Personal history of malignant neoplasm of breast: Secondary | ICD-10-CM

## 2016-02-10 ENCOUNTER — Ambulatory Visit
Admission: RE | Admit: 2016-02-10 | Discharge: 2016-02-10 | Disposition: A | Discharge status: Home / Self Care | Payer: BLUE CROSS/BLUE SHIELD | Source: Ambulatory Visit | Attending: Family Medicine | Admitting: Family Medicine

## 2016-02-10 DIAGNOSIS — Z853 Personal history of malignant neoplasm of breast: Secondary | ICD-10-CM

## 2016-02-26 ENCOUNTER — Encounter: Payer: Self-pay | Admitting: Family Medicine

## 2016-02-26 ENCOUNTER — Ambulatory Visit (INDEPENDENT_AMBULATORY_CARE_PROVIDER_SITE_OTHER): Payer: BLUE CROSS/BLUE SHIELD | Admitting: Family Medicine

## 2016-02-26 VITALS — BP 139/94 | HR 88 | Temp 98.4°F | Wt 179.0 lb

## 2016-02-26 DIAGNOSIS — Z114 Encounter for screening for human immunodeficiency virus [HIV]: Secondary | ICD-10-CM | POA: Diagnosis not present

## 2016-02-26 DIAGNOSIS — Z79899 Other long term (current) drug therapy: Secondary | ICD-10-CM

## 2016-02-26 DIAGNOSIS — J3081 Allergic rhinitis due to animal (cat) (dog) hair and dander: Secondary | ICD-10-CM | POA: Diagnosis not present

## 2016-02-26 DIAGNOSIS — G4700 Insomnia, unspecified: Secondary | ICD-10-CM

## 2016-02-26 DIAGNOSIS — I10 Essential (primary) hypertension: Secondary | ICD-10-CM | POA: Diagnosis not present

## 2016-02-26 DIAGNOSIS — E785 Hyperlipidemia, unspecified: Secondary | ICD-10-CM

## 2016-02-26 DIAGNOSIS — J309 Allergic rhinitis, unspecified: Secondary | ICD-10-CM | POA: Insufficient documentation

## 2016-02-26 DIAGNOSIS — Z1159 Encounter for screening for other viral diseases: Secondary | ICD-10-CM

## 2016-02-26 LAB — BASIC METABOLIC PANEL
BUN: 13 mg/dL (ref 7–25)
CHLORIDE: 106 mmol/L (ref 98–110)
CO2: 22 mmol/L (ref 20–31)
Calcium: 9.2 mg/dL (ref 8.6–10.4)
Creat: 0.92 mg/dL (ref 0.50–1.05)
Glucose, Bld: 96 mg/dL (ref 65–99)
POTASSIUM: 4.2 mmol/L (ref 3.5–5.3)
SODIUM: 140 mmol/L (ref 135–146)

## 2016-02-26 LAB — CBC
HEMATOCRIT: 44.2 % (ref 35.0–45.0)
HEMOGLOBIN: 15 g/dL (ref 11.7–15.5)
MCH: 29.2 pg (ref 27.0–33.0)
MCHC: 33.9 g/dL (ref 32.0–36.0)
MCV: 86.2 fL (ref 80.0–100.0)
MPV: 12.2 fL (ref 7.5–12.5)
Platelets: 278 10*3/uL (ref 140–400)
RBC: 5.13 MIL/uL — AB (ref 3.80–5.10)
RDW: 13.1 % (ref 11.0–15.0)
WBC: 9.8 10*3/uL (ref 3.8–10.8)

## 2016-02-26 LAB — POCT GLYCOSYLATED HEMOGLOBIN (HGB A1C): HEMOGLOBIN A1C: 5.9

## 2016-02-26 MED ORDER — FLUTICASONE PROPIONATE 50 MCG/ACT NA SUSP: 2.0000 | Freq: Every day | NASAL | 6 refills | Status: DC

## 2016-02-26 MED ORDER — ZOLPIDEM TARTRATE 5 MG PO TABS: 5.0000 mg | ORAL_TABLET | Freq: Every evening | ORAL | 2 refills | Status: DC | PRN

## 2016-02-26 NOTE — Patient Instructions (Signed)
You like have an allergy to your cat. Please use the flonase as prescribed. We will send you to an allergist. I am not sure if your insurance will cover an allergy test.  We will check blood work today.  Please come back to see your regular doctor soon.  Take care,  Dr Jerline Pain

## 2016-02-26 NOTE — Assessment & Plan Note (Signed)
Likely allergy to new kitten in house. Consider nasal irritation related to inhalation of bleach, but less likely. Discussed with patient that allergy testing is not indicated for known mild allergies, however patient and her husband requested a referral to allergist for complete testing. That referral was placed. Also started patient on Flonase.

## 2016-02-26 NOTE — Assessment & Plan Note (Signed)
Refilled ambien. Will defer further refills to PCP.

## 2016-02-26 NOTE — Progress Notes (Signed)
    Subjective:  Carol Ward is a 55 y.o. female who presents to the [facility] today with a chief complaint of allergic rhinitis.   HPI:  Allergic Rhinitis Patient and husband recently bought a kitten 3 weeks ago and since then the patient has noticed watery eyes, scratchy through and occasional mild rash on face. No rhinorrhea. Patient has also been using bleach 2-3 times a week over the past few weeks to clean a house that her and her husband are trying to sell. No new soaps, detergents, perfumes, or other exposures. No cough or shortness of breath. Wants a referral to allergist for testing.   Insomnia Chronic problem for patient. Has been on [place] which has worked well for her. No side effects.   HCM Due for cholesterol, HIV, HCV, and A1c screenings.   ROS: Per HPI  PMH: Smoking history reviewed.    Objective:  Physical Exam: BP (!) 139/94   Pulse 88   Temp 98.4 F (36.9 C) (Oral)   Wt 179 lb (81.2 kg)   LMP 02/05/2009   SpO2 96%   BMI 28.89 kg/m   Gen: NAD, resting comfortably HEENT: OP clear. MMM. Nasal nurbinates mildly erythematous bilaterally. TMs clear bilaterally.  CV: RRR with no murmurs appreciated Pulm: NWOB, CTAB with no crackles, wheezes, or rhonchi Skin: warm, dry Neuro: grossly normal, moves all extremities Psych: Normal affect and thought content  Assessment/Plan:  Allergic rhinitis Likely allergy to new kitten in house. Consider nasal irritation related to inhalation of bleach, but less likely. Discussed with patient that allergy testing is not indicated for known mild allergies, however patient and her husband requested a referral to allergist for complete testing. That referral was placed. Also started patient on Flonase.   Insomnia Refilled ambien. Will defer further refills to PCP. HCM Will check HIV, HCV, A1c, Lipids, CBC, and BMP today.   Algis Greenhouse. Jerline Pain, [place] Resident PGY-3 02/26/2016 12:31 PM

## 2016-02-27 LAB — LIPID PANEL
CHOL/HDL RATIO: 3.3 ratio (ref <=5.0)
Cholesterol: 150 mg/dL (ref 125–200)
HDL: 45 mg/dL — AB (ref >=46)
LDL CALC: 71 mg/dL (ref <130)
TRIGLYCERIDES: 170 mg/dL — AB (ref <150)
VLDL: 34 mg/dL — AB (ref <30)

## 2016-02-27 LAB — HEPATITIS C ANTIBODY: HCV Ab: NEGATIVE

## 2016-02-27 LAB — HIV ANTIBODY (ROUTINE TESTING W REFLEX): HIV 1&2 Ab, 4th Generation: NONREACTIVE

## 2016-02-28 ENCOUNTER — Encounter: Payer: Self-pay | Admitting: Family Medicine

## 2016-03-02 ENCOUNTER — Other Ambulatory Visit: Payer: Self-pay | Admitting: *Deleted

## 2016-03-02 DIAGNOSIS — E785 Hyperlipidemia, unspecified: Secondary | ICD-10-CM

## 2016-03-02 MED ORDER — LOVASTATIN 40 MG PO TABS: 40.0000 mg | ORAL_TABLET | Freq: Every day | ORAL | 0 refills | Status: DC

## 2016-03-04 ENCOUNTER — Ambulatory Visit: Payer: BLUE CROSS/BLUE SHIELD | Admitting: Family Medicine

## 2016-03-27 ENCOUNTER — Ambulatory Visit: Payer: Self-pay | Admitting: Allergy

## 2016-04-24 ENCOUNTER — Ambulatory Visit: Payer: Self-pay | Admitting: Allergy

## 2016-06-02 ENCOUNTER — Other Ambulatory Visit: Payer: Self-pay | Admitting: *Deleted

## 2016-06-03 NOTE — Telephone Encounter (Signed)
Please let Ms. Lanford know that I have called these refills into her pharmacy. Thanks!

## 2016-06-03 NOTE — Telephone Encounter (Signed)
Husband is aware that scripts were sent to the pharmacy .Jazmin Hartsell,CMA

## 2016-07-28 ENCOUNTER — Other Ambulatory Visit: Payer: Self-pay | Admitting: Internal Medicine

## 2016-07-28 DIAGNOSIS — I10 Essential (primary) hypertension: Secondary | ICD-10-CM

## 2016-08-29 ENCOUNTER — Other Ambulatory Visit: Payer: Self-pay | Admitting: Internal Medicine

## 2016-08-29 DIAGNOSIS — E785 Hyperlipidemia, unspecified: Secondary | ICD-10-CM

## 2016-08-31 ENCOUNTER — Other Ambulatory Visit: Payer: Self-pay | Admitting: *Deleted

## 2016-08-31 MED ORDER — ZOLPIDEM TARTRATE 5 MG PO TABS: 5.0000 mg | ORAL_TABLET | Freq: Every evening | ORAL | 2 refills | Status: DC | PRN

## 2016-08-31 NOTE — Telephone Encounter (Signed)
Please let Carol Ward know that I have refilled her medication. She can pick it up at the front desk.

## 2016-08-31 NOTE — Telephone Encounter (Signed)
Patient and husband are aware that script is ready for pick up. Dudley Mages,CMA

## 2016-10-16 ENCOUNTER — Encounter: Payer: Self-pay | Admitting: Family Medicine

## 2016-10-16 ENCOUNTER — Other Ambulatory Visit: Payer: Self-pay | Admitting: Internal Medicine

## 2016-10-16 ENCOUNTER — Telehealth: Payer: Self-pay | Admitting: *Deleted

## 2016-10-16 ENCOUNTER — Ambulatory Visit (INDEPENDENT_AMBULATORY_CARE_PROVIDER_SITE_OTHER): Payer: BLUE CROSS/BLUE SHIELD | Admitting: Family Medicine

## 2016-10-16 VITALS — BP 122/60 | HR 101 | Temp 98.6°F | Ht 66.0 in | Wt 175.2 lb

## 2016-10-16 DIAGNOSIS — M79604 Pain in right leg: Secondary | ICD-10-CM | POA: Diagnosis not present

## 2016-10-16 DIAGNOSIS — Z1329 Encounter for screening for other suspected endocrine disorder: Secondary | ICD-10-CM | POA: Diagnosis not present

## 2016-10-16 DIAGNOSIS — F411 Generalized anxiety disorder: Secondary | ICD-10-CM

## 2016-10-16 DIAGNOSIS — D518 Other vitamin B12 deficiency anemias: Secondary | ICD-10-CM | POA: Diagnosis not present

## 2016-10-16 DIAGNOSIS — Z13228 Encounter for screening for other metabolic disorders: Secondary | ICD-10-CM

## 2016-10-16 DIAGNOSIS — M79601 Pain in right arm: Secondary | ICD-10-CM

## 2016-10-16 DIAGNOSIS — R51 Headache: Secondary | ICD-10-CM | POA: Diagnosis not present

## 2016-10-16 DIAGNOSIS — R519 Headache, unspecified: Secondary | ICD-10-CM

## 2016-10-16 DIAGNOSIS — R7309 Other abnormal glucose: Secondary | ICD-10-CM | POA: Diagnosis not present

## 2016-10-16 LAB — POCT GLYCOSYLATED HEMOGLOBIN (HGB A1C): Hemoglobin A1C: 5.6

## 2016-10-16 MED ORDER — NAPROXEN SODIUM 275 MG PO TABS: 275.0000 mg | ORAL_TABLET | Freq: Two times a day (BID) | ORAL | 0 refills | Status: DC | PRN

## 2016-10-16 MED ORDER — TETANUS-DIPHTH-ACELL PERTUSSIS 5-2.5-18.5 LF-MCG/0.5 IM SUSP: 0.5000 mL | Freq: Once | INTRAMUSCULAR | 0 refills | Status: AC

## 2016-10-16 NOTE — Telephone Encounter (Signed)
Patient and her husband would like for her to have a TDAP script sent to their pharmacy.  I offered it to her while in clinic but husband states that the last time they received a vaccination in office they received a bill.  Will forward to MD to send this to the pharmacy. Jazmin Hartsell,CMA

## 2016-10-16 NOTE — Progress Notes (Signed)
Subjective:     Patient ID: Carol Ward, female   DOB: 10-Jul-1961, 56 y.o.   MRN: [id number]  Shoulder Pain   Pain location: Right side pain from her right arm to finger to her right lower limb and toes. Pain is thrombing, in nature and at times she feels some numbness intermittently. This is a new problem. The current episode started in the past 7 days. The problem occurs constantly. The problem has been unchanged (She had right breast lumpectomy done in 2011 due to breast cancer, since then she had right shoulder pain at baseline but this pain that started 1 week ago is different from her previous pain). Quality: Throbbing. The pain is at a severity of 7/10. The pain is moderate. Associated symptoms include itching. Pertinent negatives include no fever, joint swelling, limited range of motion, stiffness or tingling. Associated symptoms comments: Intermittent numbness of her right arm. Currently no  numbness. Exacerbated by: Pain worsens whenever her BP is elevated. Treatments tried: Olive oil massage. The treatment provided mild relief. Family history does not include gout or rheumatoid arthritis.  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 She denies anxiety or excessive worries. She has insomnia for which her PCP placed her on Ambien.  Current Outpatient Prescriptions on File Prior to Visit  Medication Sig Dispense Refill  . lisinopril (PRINIVIL,ZESTRIL) 10 MG tablet TAKE 1.5 TABLETS (15 MG TOTAL) BY MOUTH DAILY. 90 tablet 2  . lovastatin (MEVACOR) 40 MG tablet TAKE ONE TABLET BY MOUTH EVERY NIGHT AT BEDTIME 90 tablet 0  . diclofenac sodium (VOLTAREN) 1 % GEL Apply 2 g topically 4 (four) times daily. (Patient not taking: Reported on 10/16/2016) 100 g 4  . dorzolamide-timolol (COSOPT) 22.3-6.8 MG/ML ophthalmic solution     . fluticasone (FLONASE) 50 MCG/ACT nasal spray Place 2 sprays into both nostrils daily. (Patient not taking: Reported on 10/16/2016) 16 g 6  . latanoprost (XALATAN) 0.005 %  ophthalmic solution     . zolpidem (AMBIEN) 5 MG tablet Take 1 tablet (5 mg total) by mouth at bedtime as needed for sleep. (Patient not taking: Reported on 10/16/2016) 30 tablet 2   No current facility-administered medications on file prior to visit.    Past Medical History:  Diagnosis Date  . Breast cancer, right breast [facility]) June 2010   Poorly differentiated intraductal carcinoma. s/p chemo, radiation and lumpectomy. Triple recepetro negative. BRAC1 adn BRCA2 negative.  . Diabetes mellitus, type II (HCC) 57/12   A1c 7.5   . Hyperlipidemia 6/31/2011   Taking Simvistatin. LDL [phone]  . Hypertension 2005   Treated with ACE ([facility] in [place])  . Lump or mass in breast   . Osteoarthritis of shoulder region 01/29/2011      Review of Systems  Constitutional: Negative for fever.  HENT:       Occasional ear pain  Respiratory: Negative.   Cardiovascular: Negative.   Gastrointestinal: Negative.   Musculoskeletal: Negative for stiffness.       Left side pain, arms, legs, face  Skin: Positive for itching.  Neurological: Positive for headaches. Negative for tingling, tremors, seizures, speech difficulty, weakness and light-headedness.  Psychiatric/Behavioral: Positive for sleep disturbance. The patient is not nervous/anxious.   All other systems reviewed and are negative.  Vitals:   10/16/16 1025  BP: 122/60  Pulse: (!) 101  Temp: 98.6 F (37 C)  TempSrc: Oral  SpO2: 98%  Weight: 175 lb 3.2 oz (79.5 kg)  Height: '5\' 6"'  (1.676 m)  \  Objective:   Physical Exam  Constitutional: She is oriented to person, place, and time. She appears well-developed. No distress.  HENT:  Head: Normocephalic and atraumatic.  Right Ear: External ear normal.  Left Ear: External ear normal.  Mouth/Throat: Oropharynx is clear and moist. No oropharyngeal exudate.  Eyes:  Mild exophthalmus   Neck: Neck supple. No thyromegaly present.  Cardiovascular: Normal rate, regular rhythm and normal  heart sounds.   No murmur heard. Pulmonary/Chest: Effort normal and breath sounds normal. No respiratory distress. She has no wheezes.  Abdominal: Soft. Bowel sounds are normal. She exhibits no distension and no mass. There is no tenderness.  Musculoskeletal: Normal range of motion. She exhibits no edema.  Neurological: She is alert and oriented to person, place, and time. She has normal strength and normal reflexes. No cranial nerve deficit or sensory deficit. She displays a negative Romberg sign.  No facial asymmetry, no slurring of her speech. Normal gait.  Psychiatric: Her speech is normal and behavior is normal. Judgment and thought content normal. Her mood appears anxious. Cognition and memory are normal.  Nursing note and vitals reviewed.      Assessment:     Left side pain: Shoulder, face, arm, legs, toes   Anxiety Plan:     No neurologic deficit. Her symptoms does not easily fit any diagnosis. Differentials include OA, anxiety, paresthesia due to nerve impingement. No neurologic deficit on exam. Neuro exam completely normal. MSK exam normal with no restriction to ROM of both upper and lower limbs. TSH, A1C, Vitamin B12 checked today to assess for metabolic/endocrine of paresthesia. A1C looks good. I will contact her with other results. I recommended Naproxen prn pain. Consider gabapentin in the future if no improvement. She does have some element of anxiety to her concern. Relaxation technique recommended. F/U in 1 week with PCP for reassessment. Return precaution discussed. If this worsens she is advised to go to the ED. She verbalized understanding.  Note: [place] interpreter was used to communicate with her today. More 50% of this 25 min face to face encounter was spent on evaluation,counseling and coordination of care.

## 2016-10-16 NOTE — Telephone Encounter (Signed)
I sent in a prescription for TDAP into the pharmacy. I called patient over the phone and let them know.

## 2016-10-16 NOTE — Patient Instructions (Signed)
It was nice seeing you today. I am uncertain the cause of your symptoms which are multiple. It could be due to muscle pain from over use or nerve pain due to vitamin deficiency. We are getting some labs done today. I will call you with result. Also use Naproxen as needed for pain. See your PCP in 1 week.

## 2016-10-17 LAB — TSH: TSH: 1.87 u[IU]/mL (ref 0.450–4.500)

## 2016-10-17 LAB — VITAMIN B12: Vitamin B-12: 532 pg/mL (ref 232–1245)

## 2016-10-19 NOTE — Telephone Encounter (Signed)
Patient and husband are aware of lab results and reminded about upcoming appointment on 10-22-16. Murle Otting,CMA

## 2016-10-19 NOTE — Telephone Encounter (Signed)
-----   Message from Kinnie Feil, MD sent at 10/19/2016  7:38 AM EDT ----- Please advise patient that her lab looks good. Follow up with PCP as planned this week.

## 2016-10-22 ENCOUNTER — Ambulatory Visit (INDEPENDENT_AMBULATORY_CARE_PROVIDER_SITE_OTHER): Payer: BLUE CROSS/BLUE SHIELD | Admitting: Internal Medicine

## 2016-10-22 DIAGNOSIS — G8929 Other chronic pain: Secondary | ICD-10-CM

## 2016-10-22 DIAGNOSIS — M25511 Pain in right shoulder: Secondary | ICD-10-CM | POA: Diagnosis not present

## 2016-10-22 NOTE — Progress Notes (Signed)
   [facility] Phone: [phone]  Subjective:  Carol Ward is a 56 year old female presenting to clinic for follow-up of her right upper and lower extremity pain for the last few weeks. The pain comes and goes and is sometimes worse at night. She states she has had numbness of her right fingers and toes in the past, but is not currently having any numbness of her R arm or R leg. She denies any weakness of her arms or legs. No back pain, no headache, no slurred speech. The pain feels like an "achy" pain. Nothing makes the pain worse. She has been doing some stretches at home, which have been helping. She also sees a physical therapist, which has been helping. She states that she has had intermittent pain in her right upper extremity since the lumpectomy of her R breast in 2011.  She was seen by Dr. Gwendlyn Deutscher on 10/16/16. She had TSH, A1c, and vitamin B12 labs checked, all which were normal. She was prescribed Naproxen, which hasn't helped.  ROS: See HPI for pertinent positives and negatives  Past Medical History- HTN, HLD, hx breast cancer s/p lumpectomy in 2011 with residual lymphedema  Family history reviewed for today's visit. No changes.  Social history- patient is a never smoker  Objective: BP 120/60   Pulse 81   Temp 97.7 F (36.5 C) (Oral)   Wt 176 lb (79.8 kg)   LMP 02/05/2009   SpO2 97%   BMI 28.41 kg/m  Gen: NAD, alert, cooperative with exam Right Arm: Normal ROM, normal strength, no tenderness to palpation, no gross deformity. Right Leg: Normal ROM, normal strength, no gross deformity. Neuro: CN 2-12 intact, reflexes symmetric, sensation intact to light touch in all extremities, normal gait.  Assessment/Plan: R Sided Pain: Pt having achy pain down the entire right side of her body. It is mild and comes and goes. Recently had normal TSH, A1c, and Vitamin B12. PT and stretching is helping. Unclear etiology. Do not think this is a stroke without weakness,  numbness, slurred speech. Do not think this is coming from her back because she has no back pain. May be related to osteoarthritis.  - No red flags. Will continue to monitor. - Patient should continue seeing PT and stretching - We discussed reasons to return   Hyman Bible, MD PGY-2

## 2016-10-22 NOTE — Patient Instructions (Signed)
It was so nice to see you!  I think we can continue to watch your symptoms. Please come back to see [place] immediately if you have any numbness of your whole arm or leg, weakness of your arms or legs, or slurring of your words.  We will see you back in 3 months to discuss your other medical conditions.  -Dr. Brett Albino

## 2016-10-26 NOTE — Assessment & Plan Note (Signed)
Pt having achy pain down the entire right side of her body. It is mild and comes and goes. Recently had normal TSH, A1c, and Vitamin B12. PT and stretching is helping. Unclear etiology. Do not think this is a stroke without weakness, numbness, slurred speech. Do not think this is coming from her back because she has no back pain. May be related to osteoarthritis.  - No red flags. Will continue to monitor. - Patient should continue seeing PT and stretching - We discussed reasons to return

## 2016-10-30 ENCOUNTER — Other Ambulatory Visit: Payer: Self-pay | Admitting: Family Medicine

## 2016-11-12 ENCOUNTER — Other Ambulatory Visit: Payer: Self-pay | Admitting: Internal Medicine

## 2016-11-26 ENCOUNTER — Other Ambulatory Visit: Payer: Self-pay | Admitting: Internal Medicine

## 2016-11-26 DIAGNOSIS — E785 Hyperlipidemia, unspecified: Secondary | ICD-10-CM

## 2016-11-27 ENCOUNTER — Other Ambulatory Visit: Payer: Self-pay | Admitting: Internal Medicine

## 2016-11-29 ENCOUNTER — Other Ambulatory Visit: Payer: Self-pay | Admitting: Internal Medicine

## 2016-11-30 NOTE — Telephone Encounter (Signed)
Can you please call this refill into the pharmacy? I can't send it electronically because it's a controlled substance. Thank you so much!

## 2016-11-30 NOTE — Telephone Encounter (Signed)
LM for medication on [employer]). Marshal Eskew,CMA

## 2017-01-24 ENCOUNTER — Other Ambulatory Visit: Payer: Self-pay | Admitting: Internal Medicine

## 2017-01-24 DIAGNOSIS — I10 Essential (primary) hypertension: Secondary | ICD-10-CM

## 2017-01-25 ENCOUNTER — Other Ambulatory Visit: Payer: Self-pay | Admitting: Family Medicine

## 2017-01-25 ENCOUNTER — Other Ambulatory Visit: Payer: Self-pay

## 2017-01-25 DIAGNOSIS — Z853 Personal history of malignant neoplasm of breast: Secondary | ICD-10-CM

## 2017-01-31 ENCOUNTER — Other Ambulatory Visit: Payer: Self-pay | Admitting: Internal Medicine

## 2017-02-02 NOTE — Telephone Encounter (Signed)
Husband is aware and pick up script tomorrow. Rashmi Tallent,CMA

## 2017-02-02 NOTE — Telephone Encounter (Signed)
Please let Ms. Gossen know that her prescription is available for pick-up at the front desk. Thank you!

## 2017-02-11 ENCOUNTER — Ambulatory Visit
Admission: RE | Admit: 2017-02-11 | Discharge: 2017-02-11 | Disposition: A | Discharge status: Home / Self Care | Payer: BLUE CROSS/BLUE SHIELD | Source: Ambulatory Visit | Attending: Family Medicine | Admitting: Family Medicine

## 2017-02-11 DIAGNOSIS — Z853 Personal history of malignant neoplasm of breast: Secondary | ICD-10-CM

## 2017-02-11 HISTORY — DX: Personal history of irradiation: Z92.3

## 2017-02-11 HISTORY — DX: Personal history of antineoplastic chemotherapy: Z92.21

## 2017-02-23 ENCOUNTER — Other Ambulatory Visit: Payer: Self-pay | Admitting: Internal Medicine

## 2017-02-23 DIAGNOSIS — E785 Hyperlipidemia, unspecified: Secondary | ICD-10-CM

## 2017-03-06 ENCOUNTER — Other Ambulatory Visit: Payer: Self-pay | Admitting: Internal Medicine

## 2017-03-08 NOTE — Telephone Encounter (Signed)
Please let Carol Ward know that her prescription is ready to be picked up at the front desk. Thanks!

## 2017-03-08 NOTE — Telephone Encounter (Signed)
Pts husband informed of rx ready for pick up at [facility].

## 2017-03-25 ENCOUNTER — Other Ambulatory Visit: Payer: Self-pay | Admitting: Student

## 2017-03-25 DIAGNOSIS — I10 Essential (primary) hypertension: Secondary | ICD-10-CM

## 2017-04-12 ENCOUNTER — Other Ambulatory Visit: Payer: Self-pay | Admitting: Internal Medicine

## 2017-04-12 MED ORDER — ZOLPIDEM TARTRATE 5 MG PO TABS: ORAL_TABLET | ORAL | 0 refills | Status: DC

## 2017-04-12 NOTE — Telephone Encounter (Signed)
Husband is aware of this.  Will pick up at appointment tomorrow. Jazmin Hartsell,CMA

## 2017-04-12 NOTE — Telephone Encounter (Signed)
Please let patient know that her prescription is at the front desk for her to pick up. I have given her a prescription for 3 months. Thanks!

## 2017-04-13 ENCOUNTER — Ambulatory Visit (INDEPENDENT_AMBULATORY_CARE_PROVIDER_SITE_OTHER): Payer: BLUE CROSS/BLUE SHIELD | Admitting: Internal Medicine

## 2017-04-13 ENCOUNTER — Encounter: Payer: Self-pay | Admitting: Internal Medicine

## 2017-04-13 VITALS — BP 118/80 | HR 91 | Temp 98.7°F | Wt 180.0 lb

## 2017-04-13 DIAGNOSIS — Z1389 Encounter for screening for other disorder: Secondary | ICD-10-CM | POA: Diagnosis not present

## 2017-04-13 DIAGNOSIS — N950 Postmenopausal bleeding: Secondary | ICD-10-CM

## 2017-04-13 DIAGNOSIS — R300 Dysuria: Secondary | ICD-10-CM | POA: Diagnosis not present

## 2017-04-13 LAB — HEMOCCULT GUIAC POC 1CARD (OFFICE): Fecal Occult Blood, POC: NEGATIVE

## 2017-04-13 LAB — POCT UA - MICROSCOPIC ONLY

## 2017-04-13 LAB — POCT URINALYSIS DIP (MANUAL ENTRY)
BILIRUBIN UA: NEGATIVE mg/dL
Bilirubin, UA: NEGATIVE
Blood, UA: NEGATIVE
Glucose, UA: NEGATIVE mg/dL
Nitrite, UA: NEGATIVE
PH UA: 6 (ref 5.0–8.0)
PROTEIN UA: NEGATIVE mg/dL
Urobilinogen, UA: 0.2 E.U./dL

## 2017-04-13 NOTE — Progress Notes (Signed)
u

## 2017-04-13 NOTE — Patient Instructions (Addendum)
It was so nice to see you!  I have scheduled a pelvic ultrasound to evaluate this vaginal bleeding. I would like to see you back in clinic after the ultrasound to discuss the results and further steps.  The ultrasound will take place at [facility]. Their address is [address] [place], [place].  Please follow-up with me in clinic next week.  -Dr. Brett Albino

## 2017-04-14 DIAGNOSIS — R300 Dysuria: Secondary | ICD-10-CM | POA: Insufficient documentation

## 2017-04-14 DIAGNOSIS — N950 Postmenopausal bleeding: Secondary | ICD-10-CM | POA: Insufficient documentation

## 2017-04-14 NOTE — Assessment & Plan Note (Addendum)
Patient with 1 episode of post-menopausal bleeding. LMP 2010. Not on any blood thinners. - UA performed in clinic today without RBCs - FOBT performed in clinic today was negative - Pelvic US ordered and scheduled - Follow-up with me in clinic in 1 week to discuss [place] results and for possible endometrial biopsy. - Will also need pap at next visit

## 2017-04-14 NOTE — Progress Notes (Signed)
   [facility] Phone: [phone]  Subjective:  Carol Ward is a 56 year old female presenting to clinic with vaginal bleeding. She noticed bright red vaginal bleeding last week when she was gardening. She was squatting down and pulling weeds when she noticed a feeling of "wetness" between her legs. She went to the bathroom and saw bright red blood on her bottoms. She says the amount of blood was less than the size of her palm. She has only had one episode of vaginal bleeding. Her LMP was in 2010. She denies any pelvic pain, vaginal irritation, vaginal itchiness, vaginal lesions, or dyspareunia. She endorses dysuria for the last 2 weeks. She denies hematochezia or hematuria. She denies urinary urgency or frequency.  ROS: See HPI for pertinent positives and negatives  Past Medical History- HTN, allergic rhinitis, glaucoma, hx of breast cancer, HLD  Family history reviewed for today's visit. No changes.  Social history- patient is a never smoker  Objective: BP 118/80   Pulse 91   Temp 98.7 F (37.1 C) (Oral)   Wt 180 lb (81.6 kg)   LMP 02/05/2009   SpO2 99%   BMI 29.05 kg/m  Gen: NAD, alert, cooperative with exam GU: External genitalia normal in appearance, no vaginal lesions, vaginal walls normal, cervix normal in appearance, no cervical motion tenderness, small amount of white/clear vaginal discharge present, no adnexal or uterine masses appreciated Rectal: Perianal skin tag present, no fissures, small amount of soft brown stool present in the rectum.  Assessment/Plan: Post-menopausal bleeding: Patient with 1 episode of post-menopausal bleeding. LMP 2010. Not on any blood thinners. - UA performed in clinic today without RBCs - FOBT performed in clinic today was negative - Pelvic US ordered and scheduled - Follow-up with me in clinic in 1 week to discuss [place] results and for possible endometrial biopsy. - Will also need pap at next visit  Dysuria: Patient having  some mild dysuria on and off. UA with 1+ leukocytes and trace bacteria. No nitrites and rare bacteria. - Urine discarded before culture could be obtained. - Will follow-up with this at next visit. If still having dysuria, will consider treating for UTI.   Hyman Bible, MD PGY-3

## 2017-04-14 NOTE — Assessment & Plan Note (Signed)
Patient having some mild dysuria on and off. UA with 1+ leukocytes and trace bacteria. No nitrites and rare bacteria. - Urine discarded before culture could be obtained. - Will follow-up with this at next visit. If still having dysuria, will consider treating for UTI.

## 2017-04-16 ENCOUNTER — Ambulatory Visit (HOSPITAL_COMMUNITY)
Admission: RE | Admit: 2017-04-16 | Discharge: 2017-04-16 | Disposition: A | Discharge status: Home / Self Care | Payer: BLUE CROSS/BLUE SHIELD | Source: Ambulatory Visit | Attending: Family Medicine | Admitting: Family Medicine

## 2017-04-16 DIAGNOSIS — D251 Intramural leiomyoma of uterus: Secondary | ICD-10-CM | POA: Insufficient documentation

## 2017-04-16 DIAGNOSIS — N950 Postmenopausal bleeding: Secondary | ICD-10-CM | POA: Diagnosis present

## 2017-04-22 ENCOUNTER — Other Ambulatory Visit (HOSPITAL_COMMUNITY)
Admission: RE | Admit: 2017-04-22 | Discharge: 2017-04-22 | Disposition: A | Discharge status: Home / Self Care | Payer: BLUE CROSS/BLUE SHIELD | Source: Ambulatory Visit | Attending: Family Medicine | Admitting: Family Medicine

## 2017-04-22 ENCOUNTER — Ambulatory Visit (INDEPENDENT_AMBULATORY_CARE_PROVIDER_SITE_OTHER): Payer: BLUE CROSS/BLUE SHIELD | Admitting: Internal Medicine

## 2017-04-22 ENCOUNTER — Encounter: Payer: Self-pay | Admitting: Internal Medicine

## 2017-04-22 VITALS — BP 112/74 | HR 90 | Temp 98.3°F | Wt 179.0 lb

## 2017-04-22 DIAGNOSIS — N950 Postmenopausal bleeding: Secondary | ICD-10-CM

## 2017-04-22 DIAGNOSIS — J309 Allergic rhinitis, unspecified: Secondary | ICD-10-CM | POA: Diagnosis not present

## 2017-04-22 DIAGNOSIS — I10 Essential (primary) hypertension: Secondary | ICD-10-CM | POA: Insufficient documentation

## 2017-04-22 DIAGNOSIS — Z853 Personal history of malignant neoplasm of breast: Secondary | ICD-10-CM | POA: Insufficient documentation

## 2017-04-22 DIAGNOSIS — Z124 Encounter for screening for malignant neoplasm of cervix: Secondary | ICD-10-CM | POA: Diagnosis not present

## 2017-04-22 DIAGNOSIS — E785 Hyperlipidemia, unspecified: Secondary | ICD-10-CM | POA: Insufficient documentation

## 2017-04-22 NOTE — Progress Notes (Signed)
   [facility] Phone: [phone]  Subjective:  Carol Ward is a 56 year old female presenting to clinic for follow-up of her postmenopausal bleeding. She was seen in clinic 04/13/17 with one episode of postmenopausal bleeding. Her LMP was in 2010. She has not had any bleeding since that one episode. She had a pelvic US on 9/21 that showed a 2.9cm right intramural fibroid and an endometrial thickness of 78mm. She returns today for endometrial biopsy. She is also due for a pap smear.  ROS: See HPI for pertinent positives and negatives  Past Medical History- HTN, allergic rhinitis, hx of breast cancer, HLD  Family history- no family history of uterine cancer  Social history- patient is a never smoker.  Objective: BP 112/74   Pulse 90   Temp 98.3 F (36.8 C) (Oral)   Wt 179 lb (81.2 kg)   LMP 02/05/2009   SpO2 92%   BMI 28.89 kg/m  Gen: NAD, alert, cooperative with exam GU: External genitalia normal in appearance, no vaginal lesions, vaginal walls normal, small amount of white discharge present, cervix normal in appearance.  Assessment/Plan: Postmenopausal Bleeding: Patient had 1 episode of postmenopausal bleeding. Pelvic US with 2.9cm right intramural fibroid and endometrial thickness of 69mm. - Endometrial biopsy performed today. See procedure note below. - Discussed that patient may notice some spotting and cramping over the next couple of days and that is normal. - Return precautions discussed  Encounter for pap: - Pap performed today  PROCEDURE NOTE: Endometrial Biopsy Patient given informed consent, signed copy in the chart.  Appropriate time out taken.  The patient was placed in the lithotomy position and the cervix brought into view with sterile speculum. The cervix was cleansed x 1 with betadine swabs. A tenaculum was placed in the anterior lip of the cervix. A uterine sound was used to measure the uterus. A pipelle was introduced into the uterus, suction  created, and an endometrial sample was obtained. Three attempts were made with the third attempt producing an adequate sample. All equipment was removed and accounted for.  The patient tolerated the procedure well.  Minimal spotting type bleeding.  Patient given post procedure instructions. I will notify her of any pathology results. Dr. Gwendlyn Deutscher present and gloved for entire procedure.   Hyman Bible, MD PGY-3

## 2017-04-22 NOTE — Patient Instructions (Signed)
We did an endometrial biopsy today to make sure that your endometrial cells are normal.  You may notice some vaginal bleeding for the next 1-2 days. This is normal. You may also notice some cramping. You can take Tylenol or Ibuprofen.   Endometrial Biopsy, Care After This sheet gives you information about how to care for yourself after your procedure. Your health care provider may also give you more specific instructions. If you have problems or questions, contact your health care provider. What can I expect after the procedure? After the procedure, it is common to have:  Mild cramping.  A small amount of vaginal bleeding for a few days. This is normal.  Follow these instructions at home:  Take over-the-counter and prescription medicines only as told by your health care provider.  Do not douche, use tampons, or have sexual intercourse until your health care provider approves.  Return to your normal activities as told by your health care provider. Ask your health care provider what activities are safe for you.  Follow instructions from your health care provider about any activity restrictions, such as restrictions on strenuous exercise or heavy lifting. Contact a health care provider if:  You have heavy bleeding, or bleed for longer than 2 days after the procedure.  You have bad smelling discharge from your vagina.  You have a fever or chills.  You have a burning sensation when urinating or you have difficulty urinating.  You have severe pain in your lower abdomen. Get help right away if:  You have severe cramps in your stomach or back.  You pass large blood clots.  Your bleeding increases.  You become weak or light-headed, or you pass out. Summary  After the procedure, it is common to have mild cramping and a small amount of vaginal bleeding for a few days.  Do not douche, use tampons, or have sexual intercourse until your health care provider approves.  Return to  your normal activities as told by your health care provider. Ask your health care provider what activities are safe for you. This information is not intended to replace advice given to you by your health care provider. Make sure you discuss any questions you have with your health care provider. Document Released: 05/03/2013 Document Revised: 07/29/2016 Document Reviewed: 07/29/2016 Elsevier Interactive Patient Education  2017 [employer].

## 2017-04-26 ENCOUNTER — Telehealth: Payer: Self-pay | Admitting: Internal Medicine

## 2017-04-26 LAB — CYTOLOGY - PAP
DIAGNOSIS: NEGATIVE
HPV (WINDOPATH): NOT DETECTED

## 2017-04-26 NOTE — Telephone Encounter (Signed)
Called patient and let her know that her endometrial biopsy and her pap both did not show any signs of cancerous cells. They voiced understanding. They will call back with any questions.  Hyman Bible, MD

## 2017-05-21 ENCOUNTER — Other Ambulatory Visit: Payer: Self-pay | Admitting: Internal Medicine

## 2017-05-21 DIAGNOSIS — E785 Hyperlipidemia, unspecified: Secondary | ICD-10-CM

## 2017-05-23 ENCOUNTER — Other Ambulatory Visit: Payer: Self-pay | Admitting: Internal Medicine

## 2017-05-23 DIAGNOSIS — I10 Essential (primary) hypertension: Secondary | ICD-10-CM

## 2017-07-08 ENCOUNTER — Other Ambulatory Visit: Payer: Self-pay | Admitting: Internal Medicine

## 2017-07-08 DIAGNOSIS — I10 Essential (primary) hypertension: Secondary | ICD-10-CM

## 2017-07-08 NOTE — Telephone Encounter (Signed)
Husband informed.  Jazmin Hartsell,CMA  

## 2017-07-08 NOTE — Telephone Encounter (Signed)
Please let Ms. Maclellan know that her prescription is at the front desk for her to pick up.

## 2017-07-09 ENCOUNTER — Telehealth: Payer: Self-pay | Admitting: Internal Medicine

## 2017-07-09 NOTE — Telephone Encounter (Signed)
I already sent in this refill to [facility] yesterday. Thanks!

## 2017-07-09 NOTE — Telephone Encounter (Signed)
Patient's husband came by office to request refill of:  Name of Medication(s):  lisinopril Last date of OV:  04/22/17 Pharmacy:  [facility] on [address]  Will route refill request to Clinic RN.  Discussed with patient policy to call pharmacy for future refills.  Also, discussed refills may take up to 48 hours to approve or deny.  Husband stated pharmacy had sent request several times with no response  Carol Ward

## 2017-08-16 ENCOUNTER — Other Ambulatory Visit: Payer: Self-pay | Admitting: Internal Medicine

## 2017-08-16 DIAGNOSIS — E785 Hyperlipidemia, unspecified: Secondary | ICD-10-CM

## 2017-09-23 ENCOUNTER — Other Ambulatory Visit: Payer: Self-pay | Admitting: Internal Medicine

## 2017-09-23 DIAGNOSIS — I10 Essential (primary) hypertension: Secondary | ICD-10-CM

## 2017-10-04 ENCOUNTER — Other Ambulatory Visit: Payer: Self-pay | Admitting: Internal Medicine

## 2017-10-04 DIAGNOSIS — I10 Essential (primary) hypertension: Secondary | ICD-10-CM

## 2017-11-09 ENCOUNTER — Other Ambulatory Visit: Payer: Self-pay | Admitting: Internal Medicine

## 2017-11-12 ENCOUNTER — Other Ambulatory Visit: Payer: Self-pay | Admitting: Internal Medicine

## 2017-11-12 DIAGNOSIS — E785 Hyperlipidemia, unspecified: Secondary | ICD-10-CM

## 2017-12-20 ENCOUNTER — Other Ambulatory Visit: Payer: Self-pay | Admitting: Internal Medicine

## 2017-12-20 DIAGNOSIS — I10 Essential (primary) hypertension: Secondary | ICD-10-CM

## 2018-01-03 ENCOUNTER — Other Ambulatory Visit: Payer: Self-pay | Admitting: Family Medicine

## 2018-01-03 DIAGNOSIS — Z1231 Encounter for screening mammogram for malignant neoplasm of breast: Secondary | ICD-10-CM

## 2018-02-08 ENCOUNTER — Other Ambulatory Visit: Payer: Self-pay | Admitting: *Deleted

## 2018-02-09 MED ORDER — ZOLPIDEM TARTRATE 5 MG PO TABS: ORAL_TABLET | ORAL | 0 refills | Status: DC

## 2018-02-09 NOTE — Telephone Encounter (Signed)
2nd request from pharmacy. Clydie Dillen,CMA  

## 2018-02-10 ENCOUNTER — Other Ambulatory Visit: Payer: Self-pay

## 2018-02-10 DIAGNOSIS — E785 Hyperlipidemia, unspecified: Secondary | ICD-10-CM

## 2018-02-12 MED ORDER — LOVASTATIN 40 MG PO TABS: 40.0000 mg | ORAL_TABLET | Freq: Every day | ORAL | 1 refills | Status: DC

## 2018-02-15 ENCOUNTER — Ambulatory Visit
Admission: RE | Admit: 2018-02-15 | Discharge: 2018-02-15 | Disposition: A | Discharge status: Home / Self Care | Payer: BLUE CROSS/BLUE SHIELD | Source: Ambulatory Visit | Attending: Family Medicine | Admitting: Family Medicine

## 2018-02-15 DIAGNOSIS — Z1231 Encounter for screening mammogram for malignant neoplasm of breast: Secondary | ICD-10-CM

## 2018-03-11 ENCOUNTER — Ambulatory Visit (INDEPENDENT_AMBULATORY_CARE_PROVIDER_SITE_OTHER): Payer: BLUE CROSS/BLUE SHIELD | Admitting: Family Medicine

## 2018-03-11 ENCOUNTER — Encounter: Payer: Self-pay | Admitting: Family Medicine

## 2018-03-11 ENCOUNTER — Other Ambulatory Visit: Payer: Self-pay

## 2018-03-11 VITALS — BP 136/74 | HR 87 | Temp 98.7°F | Ht 66.0 in | Wt 189.0 lb

## 2018-03-11 DIAGNOSIS — E785 Hyperlipidemia, unspecified: Secondary | ICD-10-CM

## 2018-03-11 DIAGNOSIS — I10 Essential (primary) hypertension: Secondary | ICD-10-CM

## 2018-03-11 DIAGNOSIS — E782 Mixed hyperlipidemia: Secondary | ICD-10-CM | POA: Diagnosis not present

## 2018-03-11 DIAGNOSIS — Z0000 Encounter for general adult medical examination without abnormal findings: Secondary | ICD-10-CM

## 2018-03-11 LAB — POCT GLYCOSYLATED HEMOGLOBIN (HGB A1C): Hemoglobin A1C: 5.8 % — AB (ref 4.0–5.6)

## 2018-03-11 NOTE — Patient Instructions (Signed)
Thank you for coming in to see [place] today. Please see below to review our plan for today's visit.  1. Use heating pad for no more than 15 minutes at a time to decrease pain and tension in the right shoulder.  Use stretching exercises to decrease shoulder tension. 2.  Continue taking blood pressure medicines and checking blood pressure at home.  3. Continue taking your medications as prescribed.  4.  Labs were drawn to check your lipids and A1c.  We will call you when these results are available!   Please call the clinic at [phone] if your symptoms worsen or you have any concerns. It was our pleasure to serve you.  Dr. Milus Banister [facility], PGY-1 03/11/2018 3:43 PM

## 2018-03-11 NOTE — Progress Notes (Signed)
   Subjective:    Patient ID: Carol Ward, female    DOB: 10/30/1960, 57 y.o.   MRN: [id number]   CC: Check up  HPI: Carol Ward is a 57 year old female with history of hypertension, dyslipidemia, and chronic right-sided pain presenting for wellness checkup today she is reporting 15 to 20 days of right-sided shoulder and arm pain with intensity 4-9 out of 10 at a Christmas of the day.  She describes the pain as burning and stabbing that improves with and all of oil massage and Advil.  She denies any accidents, traumas, or falls.    She denies rashes, headache, chest pain, shortness of breath, nausea, vomiting, abdominal pain, and new or worsening body pains.   Objective:  BP 136/74   Pulse 87   Temp 98.7 F (37.1 C) (Oral)   Ht 5\' 6"  (1.676 m)   Wt 85.7 kg   LMP 02/05/2009   SpO2 98%   BMI 30.51 kg/m   Physical Exam  Constitutional: She appears well-developed and well-nourished. No distress.  HENT:  Head: Normocephalic and atraumatic.  Eyes: EOM are normal. No scleral icterus.  Neck: Normal range of motion. No thyromegaly present.  Cardiovascular: Normal rate, regular rhythm and intact distal pulses.  No murmur heard. Pulmonary/Chest: Effort normal and breath sounds normal. No respiratory distress.  Abdominal: Soft. Bowel sounds are normal.  Musculoskeletal: Normal range of motion. She exhibits tenderness (To palpation above right radial head, right trapezius body, and surrounding right shoulder blade.). She exhibits no edema or deformity.  Neurological: She is alert. No cranial nerve deficit. Coordination abnormal.  Skin: Skin is warm. Capillary refill takes less than 2 seconds. No erythema.  Psychiatric: She has a normal mood and affect.      Assessment & Plan:  HTN (hypertension) Blood pressure today was 136/74. I happy with this number and the patient reports this number is even lower at home sometimes. Plan to continue lisinopril 50 mg once daily.  HLD  (hyperlipidemia) See plan below and dyslipidemia.   Dyslipidemia The patient's last lipid panel from a couple years ago and revealed the following: Lipid Panel     Component Value Date/Time   CHOL 150 02/26/2016 1142   TRIG 170 (H) 02/26/2016 1142   HDL 45 (L) 02/26/2016 1142   CHOLHDL 3.3 02/26/2016 1142   VLDL 34 (H) 02/26/2016 1142   LDLCALC 71 02/26/2016 1142   Lipid panel was drawn today.  Patient will be contacted with results.   Periodic health assessment, general screening, adult The patient is a 57 year old slightly overweight female and qualifies for A1c testing at this time. She is currently up-to-date with other health maintenance exams.  Right shoulder pain: physical exam reveals bilateral symmetry and range of motion, sensation, motor function, and strength.  Spurling test was negative and all special tests were negative for signs of shoulder, elbow, or wrist dysfunction. - Plan to continue conservative management at this time with stretching exercises, olive oil massages, heating pads, and occasional Advil as needed for severe pain  Milus Banister, [place], PGY-1

## 2018-03-11 NOTE — Assessment & Plan Note (Signed)
Blood pressure today was 136/74. I happy with this number and the patient reports this number is even lower at home sometimes. Plan to continue lisinopril 50 mg once daily.

## 2018-03-11 NOTE — Assessment & Plan Note (Signed)
The patient is a 57 year old slightly overweight female and qualifies for A1c testing at this time. She is currently up-to-date with other health maintenance exams.

## 2018-03-11 NOTE — Assessment & Plan Note (Addendum)
See plan below and dyslipidemia.

## 2018-03-11 NOTE — Assessment & Plan Note (Addendum)
The patient's last lipid panel from a couple years ago and revealed the following: Lipid Panel     Component Value Date/Time   CHOL 150 02/26/2016 1142   TRIG 170 (H) 02/26/2016 1142   HDL 45 (L) 02/26/2016 1142   CHOLHDL 3.3 02/26/2016 1142   VLDL 34 (H) 02/26/2016 1142   LDLCALC 71 02/26/2016 1142   Lipid panel was drawn today.  Patient will be contacted with results.  She was instructed to continue lovastatin 40 mg.

## 2018-03-12 LAB — LIPID PANEL
Chol/HDL Ratio: 4 ratio (ref 0.0–4.4)
Cholesterol, Total: 159 mg/dL (ref 100–199)
HDL: 40 mg/dL (ref >39)
LDL CALC: 55 mg/dL (ref 0–99)
Triglycerides: 322 mg/dL — ABNORMAL HIGH (ref 0–149)
VLDL CHOLESTEROL CAL: 64 mg/dL — AB (ref 5–40)

## 2018-03-22 ENCOUNTER — Encounter: Payer: Self-pay | Admitting: Family Medicine

## 2018-03-22 NOTE — Progress Notes (Signed)
Patient sent a letter regarding elevated cholesterol.

## 2018-04-07 ENCOUNTER — Telehealth: Payer: Self-pay | Admitting: Family Medicine

## 2018-04-07 NOTE — Telephone Encounter (Signed)
Will forward to DO, as labs were abnormal and not sure of plan.

## 2018-04-07 NOTE — Telephone Encounter (Signed)
Pt's husband called to check on the status of his wifes results from her blood work at her last appointment 03/11/18. Pt would like for Dr. Ouida Sills to call with the results.

## 2018-04-22 ENCOUNTER — Encounter: Payer: Self-pay | Admitting: Family Medicine

## 2018-04-28 ENCOUNTER — Encounter: Payer: Self-pay | Admitting: Family Medicine

## 2018-04-28 ENCOUNTER — Other Ambulatory Visit: Payer: Self-pay

## 2018-04-28 ENCOUNTER — Ambulatory Visit (INDEPENDENT_AMBULATORY_CARE_PROVIDER_SITE_OTHER): Payer: BLUE CROSS/BLUE SHIELD | Admitting: Family Medicine

## 2018-04-28 VITALS — BP 118/66 | HR 74 | Temp 98.2°F | Ht 66.0 in | Wt 186.0 lb

## 2018-04-28 DIAGNOSIS — I10 Essential (primary) hypertension: Secondary | ICD-10-CM | POA: Diagnosis not present

## 2018-04-28 DIAGNOSIS — E782 Mixed hyperlipidemia: Secondary | ICD-10-CM | POA: Diagnosis not present

## 2018-04-28 DIAGNOSIS — T7840XD Allergy, unspecified, subsequent encounter: Secondary | ICD-10-CM

## 2018-04-28 DIAGNOSIS — T7840XA Allergy, unspecified, initial encounter: Secondary | ICD-10-CM | POA: Insufficient documentation

## 2018-04-28 MED ORDER — LOVASTATIN 40 MG PO TABS: 40.0000 mg | ORAL_TABLET | Freq: Every day | ORAL | 1 refills | Status: AC

## 2018-04-28 MED ORDER — LORATADINE 10 MG PO TABS: 10.0000 mg | ORAL_TABLET | Freq: Every day | ORAL | 11 refills | Status: AC

## 2018-04-28 NOTE — Progress Notes (Signed)
   Subjective:   Patient ID: Carol Ward, female    DOB: April 30, 1961, 57 y.o.   MRN: [id number]   CC: Review labs  HPI: Patient presents today for follow-up of labs.  She received a notification in the mail from her previous office visit that she had high triglycerides and VLDL.  The patient is also curious what she can use for her allergies of than Benadryl, as Benadryl makes her sleepy.  Review of Systems  Constitutional: Positive for weight loss (intended). Negative for chills and fever.  HENT: Negative for congestion and sinus pain.   Respiratory: Negative for cough.   Cardiovascular: Negative for chest pain, palpitations and leg swelling.  Gastrointestinal: Negative for vomiting.  Genitourinary: Negative for dysuria and flank pain.  Skin: Positive for itching (occasionally from allergies). Negative for rash.  Neurological: Negative for headaches.   Objective:  BP 118/66   Pulse 74   Temp 98.2 F (36.8 C) (Oral)   Ht 5\' 6"  (1.676 m)   Wt 186 lb (84.4 kg)   LMP 02/05/2009   BMI 30.02 kg/m   Physical Exam  Constitutional: She is oriented to person, place, and time. She appears well-developed and well-nourished.  HENT:  Head: Normocephalic.  Eyes: Conjunctivae and EOM are normal.  Neck: Normal range of motion. Neck supple.  Cardiovascular: Normal rate, regular rhythm and normal heart sounds.  Pulmonary/Chest: Effort normal and breath sounds normal. No respiratory distress.  Abdominal: Soft. Bowel sounds are normal. She exhibits no distension.  Musculoskeletal: Normal range of motion. She exhibits tenderness (tenderness to palpation of R elbow without decr ROM, ecchymosis, swelling or erythema).  Lymphadenopathy:    She has no cervical adenopathy.  Neurological: She is alert and oriented to person, place, and time.  Skin: Skin is warm. Capillary refill takes less than 2 seconds.  Psychiatric: She has a normal mood and affect. Her behavior is normal.   Assessment & Plan:     Hyperlipidemia Most recent lipid panel listed below with high triglycerides and VLDL. - Patient given instructions for improved and healthier diet to decrease lipids. - Re-draw Lipid panel in 6 months when fasting. - Would like to start Vascepa if patient's insurance will cover the meds. - Continue Lovastatin  Lipid Panel     Component Value Date/Time   CHOL 159 03/11/2018 1548   TRIG 322 (H) 03/11/2018 1548   HDL 40 03/11/2018 1548   CHOLHDL 4.0 03/11/2018 1548   CHOLHDL 3.3 02/26/2016 1142   VLDL 34 (H) 02/26/2016 1142   LDLCALC 55 03/11/2018 1548     Allergies Patient is having occasional itching and runny nose from allergies.  She has been taking Benadryl during the day but this is making her too sleepy. -Discontinue use of Benadryl during the day -Use Claritin daily for allergies   HTN (hypertension) Blood pressure at today's visit 118/66. -Continue lisinopril 10mg  daily -BMP drawn today 04/28/2018 to monitor kidney function   Return in about 6 months (around 10/28/2018) for Lab, fasting lipids.   Dr. Milus Banister [facility], PGY-1

## 2018-04-28 NOTE — Patient Instructions (Addendum)
Thank you for coming in to see [place] today! Please see below to review our plan for today's visit:  1. Make dietary modifications to decrease triglyceride levels. 2. Continue taking Lovastatin. 3. Follow up in 6 months to repeat Lipid Level while fasting.  Please call the clinic at [phone] if your symptoms worsen or you have any concerns. It was our pleasure to serve you!  Dr. Milus Banister [facility]  Food Choices to Lower Your Triglycerides Triglycerides are a type of fat in your blood. High levels of triglycerides can increase the risk of heart disease and stroke. If your triglyceride levels are high, the foods you eat and your eating habits are very important. Choosing the right foods can help lower your triglycerides. What general guidelines do I need to follow?  Lose weight if you are overweight.  Limit or avoid alcohol.  Fill one half of your plate with vegetables and green salads.  Limit fruit to two servings a day. Choose fruit instead of juice.  Make one fourth of your plate whole grains. Look for the word "whole" as the first word in the ingredient list.  Fill one fourth of your plate with lean protein foods.  Enjoy fatty fish (such as salmon, mackerel, sardines, and tuna) three times a week.  Choose healthy fats.  Limit foods high in starch and sugar.  Eat more home-cooked food and less restaurant, buffet, and fast food.  Limit fried foods.  Cook foods using methods other than frying.  Limit saturated fats.  Check ingredient lists to avoid foods with partially hydrogenated oils (trans fats) in them. What foods can I eat? Grains Whole grains, such as whole wheat or whole grain breads, crackers, cereals, and pasta. Unsweetened oatmeal, bulgur, barley, quinoa, or brown rice. Corn or whole wheat flour tortillas. Vegetables Fresh or frozen vegetables (raw, steamed, roasted, or grilled). Green salads. Fruits All fresh, canned (in natural  juice), or frozen fruits. Meat and Other Protein Products Ground beef (85% or leaner), grass-fed beef, or beef trimmed of fat. Skinless chicken or [place]. Ground chicken or [place]. Pork trimmed of fat. All fish and seafood. Eggs. Dried beans, peas, or lentils. Unsalted nuts or seeds. Unsalted canned or dry beans. Dairy Low-fat dairy products, such as skim or 1% milk, 2% or reduced-fat cheeses, low-fat ricotta or cottage cheese, or plain low-fat yogurt. Fats and Oils Tub margarines without trans fats. Light or reduced-fat mayonnaise and salad dressings. Avocado. Safflower, olive, or canola oils. Natural peanut or almond butter. The items listed above may not be a complete list of recommended foods or beverages. Contact your dietitian for more options. What foods are not recommended? Grains White bread. White pasta. White rice. Cornbread. Bagels, pastries, and croissants. Crackers that contain trans fat. Vegetables White potatoes. Corn. Creamed or fried vegetables. Vegetables in a cheese sauce. Fruits Dried fruits. Canned fruit in light or heavy syrup. Fruit juice. Meat and Other Protein Products Fatty cuts of meat. Ribs, chicken wings, bacon, sausage, bologna, salami, chitterlings, fatback, hot dogs, bratwurst, and packaged luncheon meats. Dairy Whole or 2% milk, cream, half-and-half, and cream cheese. Whole-fat or sweetened yogurt. Full-fat cheeses. Nondairy creamers and whipped toppings. Processed cheese, cheese spreads, or cheese curds. Sweets and Desserts Corn syrup, sugars, honey, and molasses. Candy. Jam and jelly. Syrup. Sweetened cereals. Cookies, pies, cakes, donuts, muffins, and ice cream. Fats and Oils Butter, stick margarine, lard, shortening, ghee, or bacon fat. Coconut, palm kernel, or palm oils. Beverages Alcohol. Sweetened drinks (such  as sodas, lemonade, and fruit drinks or punches). The items listed above may not be a complete list of foods and beverages to avoid. Contact  your dietitian for more information. This information is not intended to replace advice given to you by your health care provider. Make sure you discuss any questions you have with your health care provider. Document Released: 04/30/2004 Document Revised: 12/19/2015 Document Reviewed: 05/17/2013 Elsevier Interactive Patient Education  2017 [employer].

## 2018-04-28 NOTE — Assessment & Plan Note (Signed)
Blood pressure at today's visit 118/66. -Continue lisinopril 10mg  daily -BMP drawn today 04/28/2018 to monitor kidney function

## 2018-04-28 NOTE — Assessment & Plan Note (Signed)
Patient is having occasional itching and runny nose from allergies.  She has been taking Benadryl during the day but this is making her too sleepy. -Discontinue use of Benadryl during the day -Use Claritin daily for allergies

## 2018-04-28 NOTE — Assessment & Plan Note (Signed)
Most recent lipid panel listed below with high triglycerides and VLDL. - Patient given instructions for improved and healthier diet to decrease lipids. - Re-draw Lipid panel in 6 months when fasting. - Would like to start Vascepa if patient's insurance will cover the meds. - Continue Lovastatin  Lipid Panel     Component Value Date/Time   CHOL 159 03/11/2018 1548   TRIG 322 (H) 03/11/2018 1548   HDL 40 03/11/2018 1548   CHOLHDL 4.0 03/11/2018 1548   CHOLHDL 3.3 02/26/2016 1142   VLDL 34 (H) 02/26/2016 1142   LDLCALC 55 03/11/2018 1548

## 2018-04-29 ENCOUNTER — Encounter: Payer: Self-pay | Admitting: Family Medicine

## 2018-04-29 LAB — BASIC METABOLIC PANEL
BUN/Creatinine Ratio: 14 (ref 9–23)
BUN: 11 mg/dL (ref 6–24)
CO2: 23 mmol/L (ref 20–29)
CREATININE: 0.77 mg/dL (ref 0.57–1.00)
Calcium: 9.6 mg/dL (ref 8.7–10.2)
Chloride: 102 mmol/L (ref 96–106)
GFR calc Af Amer: 98 mL/min/{1.73_m2} (ref >59)
GFR, EST NON AFRICAN AMERICAN: 85 mL/min/{1.73_m2} (ref >59)
Glucose: 101 mg/dL — ABNORMAL HIGH (ref 65–99)
Potassium: 4.1 mmol/L (ref 3.5–5.2)
Sodium: 141 mmol/L (ref 134–144)

## 2018-04-29 NOTE — Progress Notes (Signed)
BMP was drawn to monitor patient's kidney function due to history of hypertension. Kidney function is WNL. Letter sent, patient notified.  Milus Banister, [place], PGY-1 04/29/2018 8:48 AM

## 2018-05-12 ENCOUNTER — Other Ambulatory Visit: Payer: Self-pay | Admitting: Family Medicine

## 2018-06-06 ENCOUNTER — Other Ambulatory Visit: Payer: Self-pay

## 2018-06-06 DIAGNOSIS — I10 Essential (primary) hypertension: Secondary | ICD-10-CM

## 2018-06-06 MED ORDER — LISINOPRIL 10 MG PO TABS: 15.0000 mg | ORAL_TABLET | Freq: Every day | ORAL | 0 refills | Status: DC

## 2018-06-08 ENCOUNTER — Encounter: Payer: Self-pay | Admitting: Family Medicine

## 2018-08-26 ENCOUNTER — Other Ambulatory Visit: Payer: Self-pay | Admitting: Family Medicine

## 2018-09-01 ENCOUNTER — Other Ambulatory Visit: Payer: Self-pay | Admitting: Family Medicine

## 2018-09-01 DIAGNOSIS — I10 Essential (primary) hypertension: Secondary | ICD-10-CM

## 2019-02-06 ENCOUNTER — Encounter (HOSPITAL_COMMUNITY): Payer: Self-pay | Admitting: *Deleted

## 2019-02-06 ENCOUNTER — Emergency Department (HOSPITAL_COMMUNITY): Payer: BLUE CROSS/BLUE SHIELD

## 2019-02-06 ENCOUNTER — Emergency Department (HOSPITAL_COMMUNITY)
Admission: EM | Admit: 2019-02-06 | Discharge: 2019-02-07 | Disposition: A | Discharge status: Home / Self Care | Payer: BLUE CROSS/BLUE SHIELD | Attending: Emergency Medicine | Admitting: Emergency Medicine

## 2019-02-06 ENCOUNTER — Other Ambulatory Visit: Payer: Self-pay

## 2019-02-06 DIAGNOSIS — I10 Essential (primary) hypertension: Secondary | ICD-10-CM | POA: Diagnosis not present

## 2019-02-06 DIAGNOSIS — E119 Type 2 diabetes mellitus without complications: Secondary | ICD-10-CM | POA: Diagnosis not present

## 2019-02-06 DIAGNOSIS — I159 Secondary hypertension, unspecified: Secondary | ICD-10-CM

## 2019-02-06 DIAGNOSIS — R030 Elevated blood-pressure reading, without diagnosis of hypertension: Secondary | ICD-10-CM | POA: Diagnosis present

## 2019-02-06 DIAGNOSIS — Z853 Personal history of malignant neoplasm of breast: Secondary | ICD-10-CM | POA: Insufficient documentation

## 2019-02-06 DIAGNOSIS — Z79899 Other long term (current) drug therapy: Secondary | ICD-10-CM | POA: Diagnosis not present

## 2019-02-06 LAB — BASIC METABOLIC PANEL
Anion gap: 10 (ref 5–15)
BUN: 10 mg/dL (ref 6–20)
CO2: 24 mmol/L (ref 22–32)
Calcium: 9.3 mg/dL (ref 8.9–10.3)
Chloride: 105 mmol/L (ref 98–111)
Creatinine, Ser: 1.08 mg/dL — ABNORMAL HIGH (ref 0.44–1.00)
GFR calc Af Amer: >60 mL/min (ref >60)
GFR calc non Af Amer: 56 mL/min — ABNORMAL LOW (ref >60)
Glucose, Bld: 122 mg/dL — ABNORMAL HIGH (ref 70–99)
Potassium: 3.6 mmol/L (ref 3.5–5.1)
Sodium: 139 mmol/L (ref 135–145)

## 2019-02-06 LAB — CBC
HCT: 44.8 % (ref 36.0–46.0)
Hemoglobin: 15.3 g/dL — ABNORMAL HIGH (ref 12.0–15.0)
MCH: 29.8 pg (ref 26.0–34.0)
MCHC: 34.2 g/dL (ref 30.0–36.0)
MCV: 87.3 fL (ref 80.0–100.0)
Platelets: 258 10*3/uL (ref 150–400)
RBC: 5.13 MIL/uL — ABNORMAL HIGH (ref 3.87–5.11)
RDW: 12 % (ref 11.5–15.5)
WBC: 10.7 10*3/uL — ABNORMAL HIGH (ref 4.0–10.5)
nRBC: 0 % (ref 0.0–0.2)

## 2019-02-06 MED ORDER — SODIUM CHLORIDE 0.9% FLUSH: 3.0000 mL | Freq: Once | INTRAVENOUS | Status: DC

## 2019-02-06 NOTE — ED Triage Notes (Signed)
To ED for eval of cp, sob, and back pain for the past few days. States she feels better today. HTN - pmd has changed her meds some but not working. No nausea or vomiting. No neuro deficits. Grips equal and strong. Appears in nad

## 2019-02-07 NOTE — ED Notes (Signed)
ED Provider at bedside. 

## 2019-02-07 NOTE — ED Provider Notes (Signed)
[facility] EMERGENCY DEPARTMENT Provider Note  CSN: [id number] Arrival date & time: 02/06/19 1854  Chief Complaint(s) Shortness of Breath, Chest Pain, and Back Pain  HPI Carol Ward is a 58 y.o. female with a history listed below who presents to the emergency department for elevated blood pressures.  In triage it was noted the patient reported chest pain or shortness of breath but patient denies that she has not at staff.  States that she felt that several days ago but is currently asymptomatic.  Reported that she felt some upper back pain but again is now asymptomatic.  Her main concern is her elevated blood pressures.  She reports that she was recently transitioned from ACE inhibitor to ARBs 2 weeks ago and has noticed that her blood pressures are slowly trending up.  She denies any associated leg swelling.  No nausea or vomiting.  No abdominal pain.  No recent fevers or infections.  Denies any other physical complaints.  HPI  Past Medical History Past Medical History:  Diagnosis Date  . Breast cancer, right breast [facility]) June 2010   Poorly differentiated intraductal carcinoma. s/p chemo, radiation and lumpectomy. Triple recepetro negative. BRAC1 adn BRCA2 negative.  . Diabetes mellitus, type II (HCC) 57/12   A1c 7.5   . Hyperlipidemia 6/31/2011   Taking Simvistatin. LDL 217--127-107  . Hypertension 2005   Treated with ACE ([facility] in [place])  . Lump or mass in breast   . Osteoarthritis of shoulder region 01/29/2011  . Personal history of chemotherapy   . Personal history of radiation therapy    Patient Active Problem List   Diagnosis Date Noted  . Allergies 04/28/2018  . Dyslipidemia 03/11/2018  . Post-menopausal bleeding 04/14/2017  . Allergic rhinitis 02/26/2016  . Periodic health assessment, general screening, adult 01/12/2014  . Lymphedema Right arm and right breast 07/24/2013  . Chronic closed-angle glaucoma 07/12/2012  . HTN (hypertension) 12/11/2010   . Hyperlipidemia 12/11/2010  . History of breast cancer 12/11/2010   Home Medication(s) Prior to Admission medications   Medication Sig Start Date End Date Taking? Authorizing Provider  dorzolamide-timolol (COSOPT) 22.3-6.8 MG/ML ophthalmic solution  03/18/15   [provider]  fluticasone (FLONASE) 50 MCG/ACT nasal spray Place 2 sprays into both nostrils daily. Patient not taking: Reported on 10/16/2016 02/26/16   Vivi Barrack, MD  latanoprost Ivin Poot) 0.005 % ophthalmic solution  03/18/15   [provider]  lisinopril (PRINIVIL,ZESTRIL) 10 MG tablet Take 1.5 tablets (15 mg total) by mouth daily. 09/01/18   Daisy Floro, DO  loratadine (CLARITIN) 10 MG tablet Take 1 tablet (10 mg total) by mouth daily. 04/28/18   Daisy Floro, DO  lovastatin (MEVACOR) 40 MG tablet Take 1 tablet (40 mg total) by mouth at bedtime. 04/28/18   Daisy Floro, DO  naproxen sodium (ANAPROX) 275 MG tablet TAKE ONE TABLET BY MOUTH TWICE A DAY AS NEEDED Patient not taking: Reported on 04/28/2018 11/30/16   Mayo, Pete Pelt, MD  zolpidem (AMBIEN) 5 MG tablet TAKE ONE TABLET BY MOUTH EVERY NIGHT AT BEDTIME 08/26/18   Daisy Floro, DO  Past Surgical History Past Surgical History:  Procedure Laterality Date  . BREAST LUMPECTOMY  Jan 2011   R breast    Family History No family history on file.  Social History Social History   Tobacco Use  . Smoking status: Never Smoker  . Smokeless tobacco: Never Used  Substance Use Topics  . Alcohol use: No  . Drug use: No   Allergies Patient has no known allergies.  Review of Systems Review of Systems All other systems are reviewed and are negative for acute change except as noted in the HPI  Physical Exam Vital Signs  I have reviewed the triage vital signs BP (!) 153/101 (BP Location: Left Arm)   Pulse 75    Temp 99 F (37.2 C) (Oral)   Resp 16   LMP 02/05/2009   SpO2 99%   Physical Exam Vitals signs reviewed.  Constitutional:      General: She is not in acute distress.    Appearance: She is well-developed. She is not diaphoretic.  HENT:     Head: Normocephalic and atraumatic.     Nose: Nose normal.  Eyes:     General: No scleral icterus.       Right eye: No discharge.        Left eye: No discharge.     Conjunctiva/sclera: Conjunctivae normal.     Pupils: Pupils are equal, round, and reactive to light.  Neck:     Musculoskeletal: Normal range of motion and neck supple.  Cardiovascular:     Rate and Rhythm: Normal rate and regular rhythm.     Heart sounds: No murmur. No friction rub. No gallop.   Pulmonary:     Effort: Pulmonary effort is normal. No respiratory distress.     Breath sounds: Normal breath sounds. No stridor. No rales.  Abdominal:     General: There is no distension.     Palpations: Abdomen is soft.     Tenderness: There is no abdominal tenderness.  Musculoskeletal:        General: No tenderness.     Right lower leg: No edema.     Left lower leg: No edema.  Skin:    General: Skin is warm and dry.     Findings: No erythema or rash.  Neurological:     Mental Status: She is alert and oriented to person, place, and time.     ED Results and Treatments Labs (all labs ordered are listed, but only abnormal results are displayed) Labs Reviewed  BASIC METABOLIC PANEL - Abnormal; Notable for the following components:      Result Value   Glucose, Bld 122 (*)    Creatinine, Ser 1.08 (*)    GFR calc non Af Amer 56 (*)    All other components within normal limits  CBC - Abnormal; Notable for the following components:   WBC 10.7 (*)    RBC 5.13 (*)    Hemoglobin 15.3 (*)    All other components within normal limits  EKG  EKG Interpretation   Date/Time:  Monday February 06 2019 19:07:04 EDT Ventricular Rate:  99 PR Interval:  158 QRS Duration: 80 QT Interval:  344 QTC Calculation: 441 R Axis:   -80 Text Interpretation:  Normal sinus rhythm Possible Left atrial enlargement Left axis deviation Pulmonary disease pattern RSR' or QR pattern in V1 suggests right ventricular conduction delay Abnormal ECG NO STEMI. No old tracing to compare Confirmed by Addison Lank [phone]) on 02/07/2019 1:36:13 AM      Radiology Dg Chest 2 View  Result Date: 02/06/2019 CLINICAL DATA:  Acute onset of RIGHT-sided chest pain and UPPER back pain radiating into the jaw. Current history of hypertension and diabetes. Personal history of RIGHT breast cancer in 2010. EXAM: CHEST - 2 VIEW COMPARISON:  None. FINDINGS: Cardiac silhouette normal in size. Thoracic aorta tortuous. Hilar and mediastinal contours otherwise unremarkable. Minimal linear scar or atelectasis in the lingula. Lungs otherwise clear. No localized airspace consolidation. No pleural effusions. No pneumothorax. Normal pulmonary vascularity. Thoracolumbar dextroscoliosis. IMPRESSION: Minimal linear atelectasis or scar in the lingula. No acute cardiopulmonary disease otherwise. Electronically Signed   By: Evangeline Dakin M.D.   On: 02/06/2019 20:18    Pertinent labs & imaging results that were available during my care of the patient were reviewed by me and considered in my medical decision making (see chart for details).  Medications Ordered in ED Medications  sodium chloride flush (NS) 0.9 % injection 3 mL (has no administration in time range)                                                                                                                                    Procedures Procedures  (including critical care time)  Medical Decision Making / ED Course I have reviewed the nursing notes for this encounter and the patient's prior records (if available in EHR or on provided paperwork).    Carol Ward was evaluated in Emergency Department on 02/07/2019 for the symptoms described in the history of present illness. She was evaluated in the context of the global COVID-19 pandemic, which necessitated consideration that the patient might be at risk for infection with the SARS-CoV-2 virus that causes COVID-19. Institutional protocols and algorithms that pertain to the evaluation of patients at risk for COVID-19 are in a state of rapid change based on information released by regulatory bodies including the CDC and federal and state organizations. These policies and algorithms were followed during the patient's care in the ED.  Patient's main complaint is elevated blood pressures with recent change in medication.  Currently asymptomatic.  EKG without acute ischemic changes or evidence of pericarditis. Chest x-ray without evidence suggestive of pneumonia, pneumothorax, pneumomediastinum.  No abnormal contour of the mediastinum to suggest dissection. No evidence of acute injuries.  Low suspicion for ACS.  Doubt pulmonary embolism.  Not classic for aortic dissection or esophageal perforation.  No evidence of endorgan damage.  Recommended close follow-up with PCP for blood pressure management.  The patient appears reasonably screened and/or stabilized for discharge and I doubt any other medical condition or other [facility] requiring further screening, evaluation, or treatment in the ED at this time prior to discharge.  The patient is safe for discharge with strict return precautions.        Final Clinical Impression(s) / ED Diagnoses Final diagnoses:  Secondary hypertension    The patient appears reasonably screened and/or stabilized for discharge and I doubt any other medical condition or other [facility] requiring further screening, evaluation, or treatment in the ED at this time prior to discharge.  Disposition: Discharge  Condition: Good  I have discussed the results, Dx and Tx plan with the  patient who expressed understanding and agree(s) with the plan. Discharge instructions discussed at great length. The patient was given strict return precautions who verbalized understanding of the instructions. No further questions at time of discharge.    ED Discharge Orders    None       Follow Up: Primary care provider  Schedule an appointment as soon as possible for a visit        This chart was dictated using voice recognition software.  Despite best efforts to proofread,  errors can occur which can change the documentation meaning.   Fatima Blank, MD 02/07/19 [phone]

## 2019-02-16 ENCOUNTER — Ambulatory Visit: Payer: Self-pay | Admitting: Cardiology

## 2019-02-20 IMAGING — MG DIGITAL SCREENING BILATERAL MAMMOGRAM WITH TOMO AND CAD
8 series · 8 of 24 positions shown · non-contrast
Comparison: Previous exam(s).

CLINICAL DATA: Screening. Prior malignant lumpectomy the RIGHT
breast in July 2009, triple negative poorly differentiated
intraductal carcinoma.

EXAM:
DIGITAL SCREENING BILATERAL MAMMOGRAM WITH TOMO AND CAD

[L MLO synth-2D]
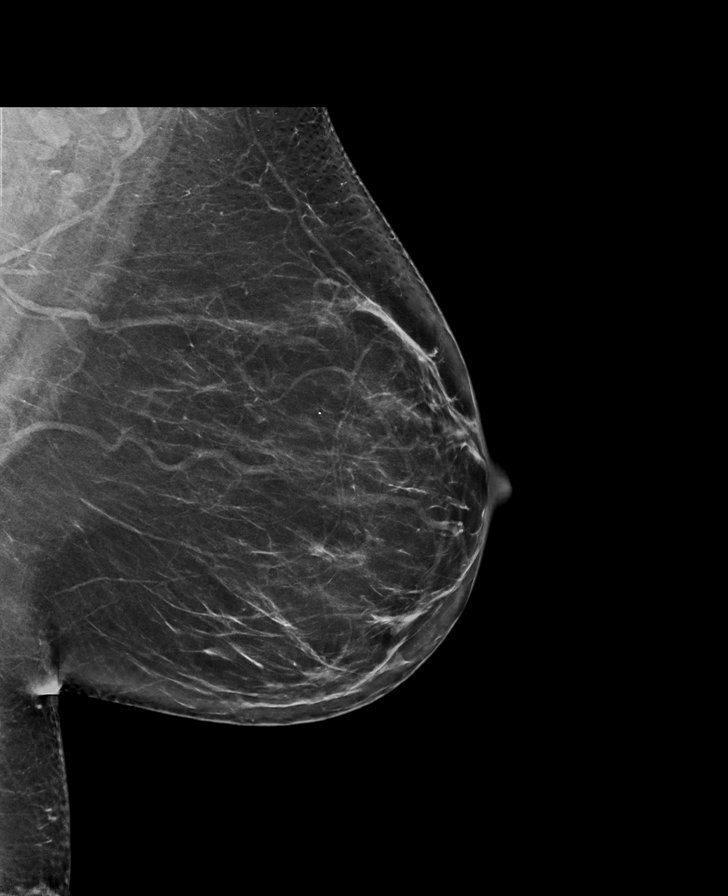

[R MLO synth-2D]
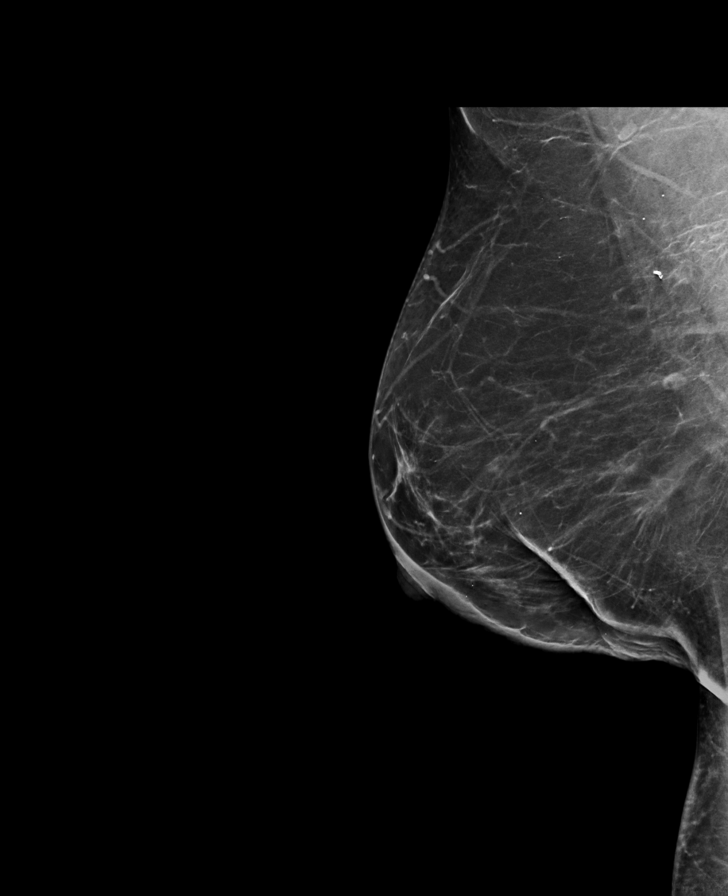

[R CC synth-2D]
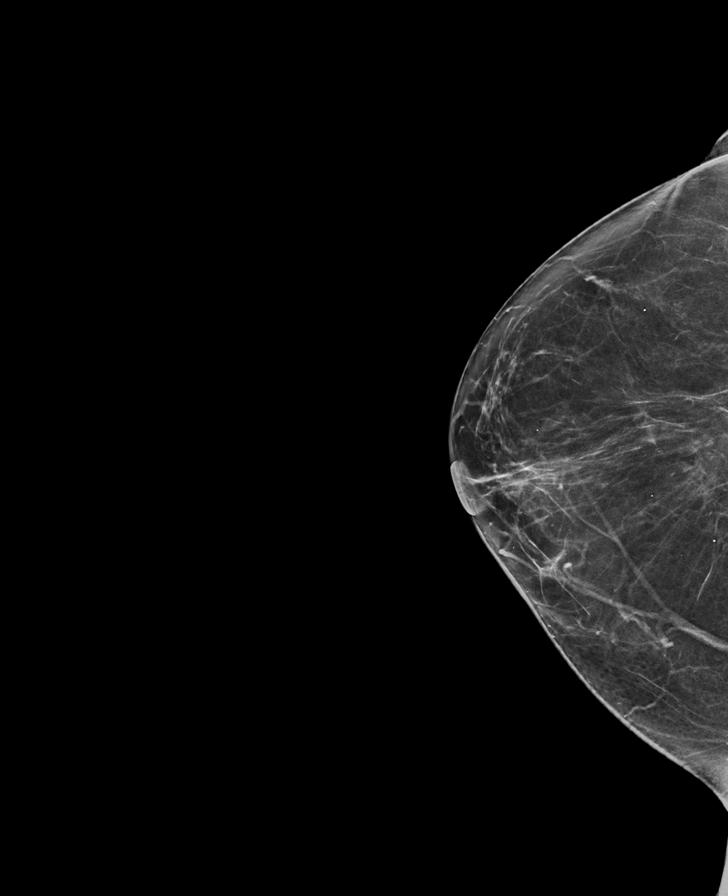

[L CC synth-2D]
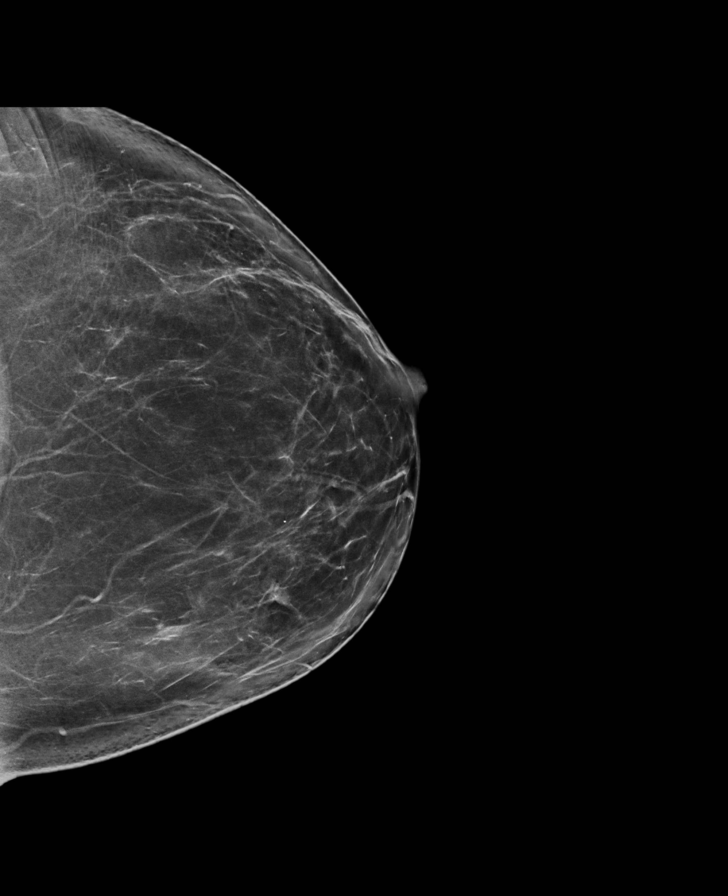

[R MLO tomo · tomo slice 39/76.0]
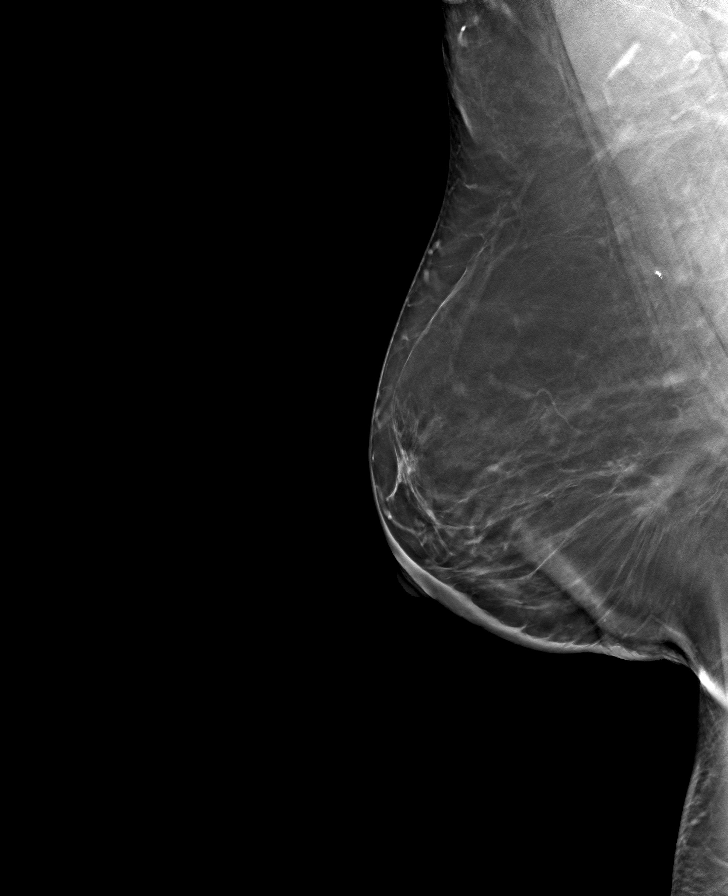

[L MLO tomo · tomo slice 41/82.0]
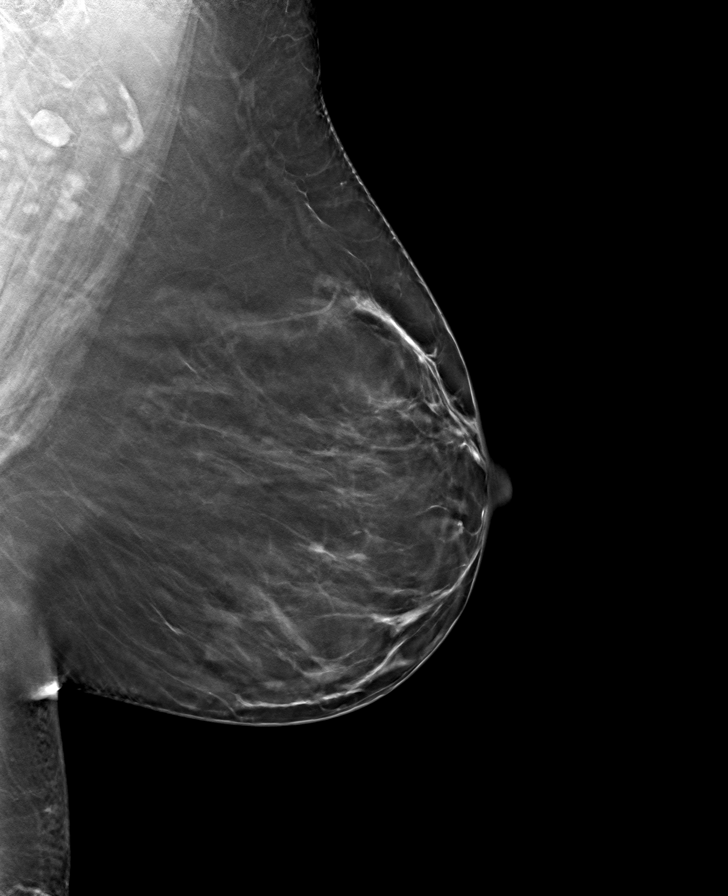

[L CC tomo · tomo slice 41/80.0]
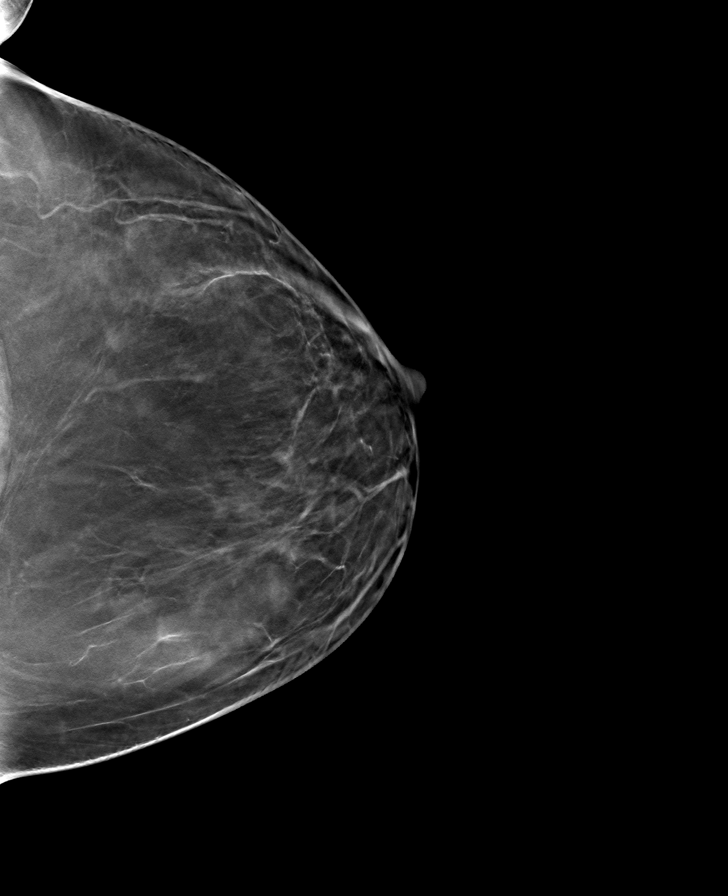

[R CC tomo · tomo slice 33/66.0]
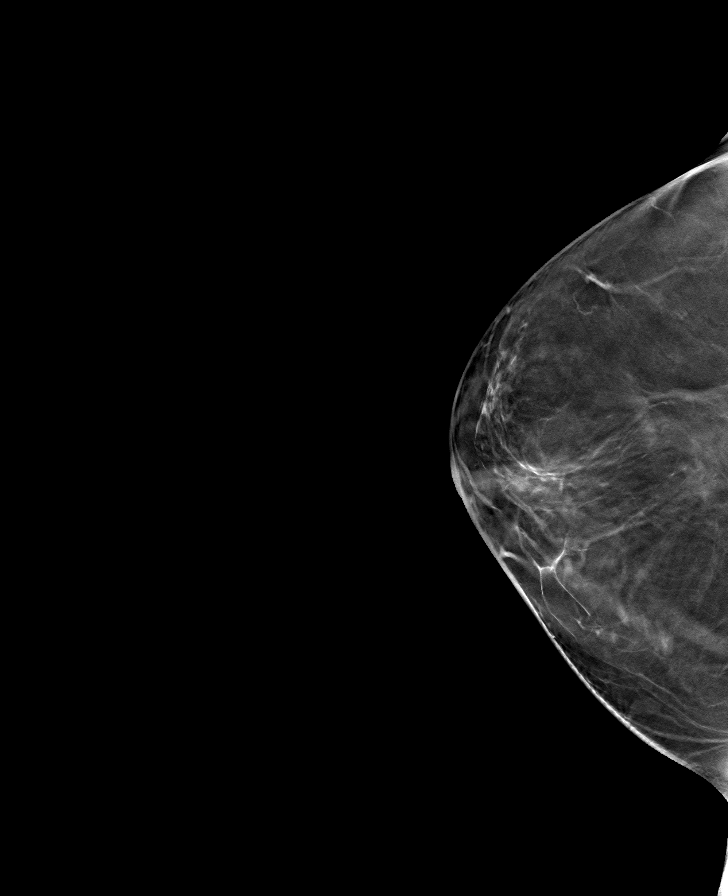

[8 of 24 positions shown; findings below may reference images not displayed]

ACR Breast Density Category b: There are scattered areas of
fibroglandular density.
FINDINGS: There are no findings suspicious for malignancy. Images were
processed with CAD.
IMPRESSION: No mammographic evidence of malignancy. A result letter of this
screening mammogram will be mailed directly to the patient.

RECOMMENDATION:
Screening mammogram in one year. (Code:MD-L-Y2U)

BI-RADS CATEGORY  2: Benign.

## 2021-01-22 ENCOUNTER — Other Ambulatory Visit: Payer: Self-pay | Admitting: Family Medicine

## 2022-07-07 ENCOUNTER — Emergency Department (EMERGENCY_DEPARTMENT_HOSPITAL): Payer: Medicaid Other | Admitting: Anesthesiology

## 2022-07-07 ENCOUNTER — Other Ambulatory Visit: Payer: Self-pay

## 2022-07-07 ENCOUNTER — Encounter (HOSPITAL_COMMUNITY)
Admission: EM | Disposition: A | Discharge status: Home / Self Care | Payer: Self-pay | Source: Home / Self Care | Attending: Emergency Medicine

## 2022-07-07 ENCOUNTER — Ambulatory Visit (HOSPITAL_COMMUNITY)
Admission: EM | Admit: 2022-07-07 | Discharge: 2022-07-07 | Disposition: A | Discharge status: Home / Self Care | Payer: Medicaid Other | Attending: Emergency Medicine | Admitting: Emergency Medicine

## 2022-07-07 ENCOUNTER — Emergency Department (HOSPITAL_COMMUNITY): Payer: Medicaid Other | Admitting: Anesthesiology

## 2022-07-07 ENCOUNTER — Encounter (HOSPITAL_COMMUNITY): Payer: Self-pay

## 2022-07-07 DIAGNOSIS — Z79899 Other long term (current) drug therapy: Secondary | ICD-10-CM | POA: Diagnosis not present

## 2022-07-07 DIAGNOSIS — I10 Essential (primary) hypertension: Secondary | ICD-10-CM | POA: Diagnosis not present

## 2022-07-07 DIAGNOSIS — K821 Hydrops of gallbladder: Secondary | ICD-10-CM | POA: Insufficient documentation

## 2022-07-07 DIAGNOSIS — K819 Cholecystitis, unspecified: Secondary | ICD-10-CM

## 2022-07-07 DIAGNOSIS — E119 Type 2 diabetes mellitus without complications: Secondary | ICD-10-CM | POA: Diagnosis not present

## 2022-07-07 DIAGNOSIS — K8012 Calculus of gallbladder with acute and chronic cholecystitis without obstruction: Secondary | ICD-10-CM | POA: Insufficient documentation

## 2022-07-07 DIAGNOSIS — Z853 Personal history of malignant neoplasm of breast: Secondary | ICD-10-CM | POA: Diagnosis not present

## 2022-07-07 HISTORY — PX: CHOLECYSTECTOMY: SHX55

## 2022-07-07 LAB — COMPREHENSIVE METABOLIC PANEL
ALT: 18 U/L (ref 0–44)
AST: 19 U/L (ref 15–41)
Albumin: 4 g/dL (ref 3.5–5.0)
Alkaline Phosphatase: 66 U/L (ref 38–126)
Anion gap: 11 (ref 5–15)
BUN: 9 mg/dL (ref 8–23)
CO2: 26 mmol/L (ref 22–32)
Calcium: 9.6 mg/dL (ref 8.9–10.3)
Chloride: 103 mmol/L (ref 98–111)
Creatinine, Ser: 0.87 mg/dL (ref 0.44–1.00)
GFR, Estimated: >60 mL/min (ref >60)
Glucose, Bld: 96 mg/dL (ref 70–99)
Potassium: 3.2 mmol/L — ABNORMAL LOW (ref 3.5–5.1)
Sodium: 140 mmol/L (ref 135–145)
Total Bilirubin: 0.4 mg/dL (ref 0.3–1.2)
Total Protein: 7.3 g/dL (ref 6.5–8.1)

## 2022-07-07 LAB — CBC WITH DIFFERENTIAL/PLATELET
Abs Immature Granulocytes: 0.03 10*3/uL (ref 0.00–0.07)
Basophils Absolute: 0.1 10*3/uL (ref 0.0–0.1)
Basophils Relative: 1 %
Eosinophils Absolute: 0.5 10*3/uL (ref 0.0–0.5)
Eosinophils Relative: 6 %
HCT: 42.2 % (ref 36.0–46.0)
Hemoglobin: 14.6 g/dL (ref 12.0–15.0)
Immature Granulocytes: 0 %
Lymphocytes Relative: 23 %
Lymphs Abs: 1.9 10*3/uL (ref 0.7–4.0)
MCH: 29.1 pg (ref 26.0–34.0)
MCHC: 34.6 g/dL (ref 30.0–36.0)
MCV: 84.2 fL (ref 80.0–100.0)
Monocytes Absolute: 0.8 10*3/uL (ref 0.1–1.0)
Monocytes Relative: 9 %
Neutro Abs: 5 10*3/uL (ref 1.7–7.7)
Neutrophils Relative %: 61 %
Platelets: 305 10*3/uL (ref 150–400)
RBC: 5.01 MIL/uL (ref 3.87–5.11)
RDW: 12.4 % (ref 11.5–15.5)
WBC: 8.3 10*3/uL (ref 4.0–10.5)
nRBC: 0 % (ref 0.0–0.2)

## 2022-07-07 LAB — URINALYSIS, ROUTINE W REFLEX MICROSCOPIC
Bilirubin Urine: NEGATIVE
Glucose, UA: NEGATIVE mg/dL
Hgb urine dipstick: NEGATIVE
Ketones, ur: NEGATIVE mg/dL
Nitrite: NEGATIVE
Protein, ur: NEGATIVE mg/dL
Specific Gravity, Urine: 1.004 — ABNORMAL LOW (ref 1.005–1.030)
pH: 7 (ref 5.0–8.0)

## 2022-07-07 LAB — LIPASE, BLOOD: Lipase: 31 U/L (ref 11–51)

## 2022-07-07 SURGERY — LAPAROSCOPIC CHOLECYSTECTOMY
Anesthesia: General

## 2022-07-07 MED ORDER — FENTANYL CITRATE (PF) 100 MCG/2ML IJ SOLN
INTRAMUSCULAR | Status: AC
  Filled 2022-07-07: qty 2

## 2022-07-07 MED ORDER — OXYCODONE HCL 5 MG PO TABS: 5.0000 mg | ORAL_TABLET | ORAL | 0 refills | Status: DC | PRN

## 2022-07-07 MED ORDER — FENTANYL CITRATE (PF) 100 MCG/2ML IJ SOLN
25.0000 ug | INTRAMUSCULAR | Status: DC | PRN
  Administered 2022-07-07: 25 ug via INTRAVENOUS

## 2022-07-07 MED ORDER — LIDOCAINE 2% (20 MG/ML) 5 ML SYRINGE
INTRAMUSCULAR | Status: DC | PRN
  Administered 2022-07-07: 60 mg via INTRAVENOUS

## 2022-07-07 MED ORDER — ONDANSETRON HCL 4 MG/2ML IJ SOLN: 4.0000 mg | Freq: Four times a day (QID) | INTRAMUSCULAR | Status: DC | PRN

## 2022-07-07 MED ORDER — OXYCODONE HCL 5 MG PO TABS
ORAL_TABLET | ORAL | Status: AC
  Filled 2022-07-07: qty 1

## 2022-07-07 MED ORDER — BUPIVACAINE-EPINEPHRINE 0.25% -1:200000 IJ SOLN
INTRAMUSCULAR | Status: DC | PRN
  Administered 2022-07-07: 23 mL

## 2022-07-07 MED ORDER — LACTATED RINGERS IV SOLN: INTRAVENOUS | Status: DC

## 2022-07-07 MED ORDER — DEXAMETHASONE SODIUM PHOSPHATE 10 MG/ML IJ SOLN
INTRAMUSCULAR | Status: DC | PRN
  Administered 2022-07-07: 10 mg via INTRAVENOUS

## 2022-07-07 MED ORDER — ONDANSETRON HCL 4 MG/2ML IJ SOLN
INTRAMUSCULAR | Status: DC | PRN
  Administered 2022-07-07: 4 mg via INTRAVENOUS

## 2022-07-07 MED ORDER — PROPOFOL 10 MG/ML IV BOLUS
INTRAVENOUS | Status: AC
  Filled 2022-07-07: qty 20

## 2022-07-07 MED ORDER — CHLORHEXIDINE GLUCONATE 0.12 % MT SOLN
15.0000 mL | Freq: Once | OROMUCOSAL | Status: AC
  Administered 2022-07-07: 15 mL via OROMUCOSAL

## 2022-07-07 MED ORDER — KETOROLAC TROMETHAMINE 30 MG/ML IJ SOLN
INTRAMUSCULAR | Status: DC | PRN
  Administered 2022-07-07: 30 mg via INTRAVENOUS

## 2022-07-07 MED ORDER — 0.9 % SODIUM CHLORIDE (POUR BTL) OPTIME
TOPICAL | Status: DC | PRN
  Administered 2022-07-07: 1000 mL

## 2022-07-07 MED ORDER — SUGAMMADEX SODIUM 200 MG/2ML IV SOLN
INTRAVENOUS | Status: DC | PRN
  Administered 2022-07-07: 168 mg via INTRAVENOUS

## 2022-07-07 MED ORDER — PROPOFOL 10 MG/ML IV BOLUS
INTRAVENOUS | Status: DC | PRN
  Administered 2022-07-07: 130 mg via INTRAVENOUS

## 2022-07-07 MED ORDER — FENTANYL CITRATE (PF) 250 MCG/5ML IJ SOLN
INTRAMUSCULAR | Status: DC | PRN
  Administered 2022-07-07: 100 ug via INTRAVENOUS
  Administered 2022-07-07: 50 ug via INTRAVENOUS

## 2022-07-07 MED ORDER — SODIUM CHLORIDE 0.9 % IV SOLN
2.0000 g | Freq: Once | INTRAVENOUS | Status: AC
  Administered 2022-07-07: 2 g via INTRAVENOUS
  Filled 2022-07-07: qty 20

## 2022-07-07 MED ORDER — ORAL CARE MOUTH RINSE: 15.0000 mL | Freq: Once | OROMUCOSAL | Status: AC

## 2022-07-07 MED ORDER — BUPIVACAINE-EPINEPHRINE (PF) 0.25% -1:200000 IJ SOLN
INTRAMUSCULAR | Status: AC
  Filled 2022-07-07: qty 30

## 2022-07-07 MED ORDER — ACETAMINOPHEN 500 MG PO TABS: 1000.0000 mg | ORAL_TABLET | Freq: Four times a day (QID) | ORAL | 2 refills | Status: AC | PRN

## 2022-07-07 MED ORDER — SODIUM CHLORIDE 0.9 % IR SOLN
Status: DC | PRN
  Administered 2022-07-07: 1500 mL

## 2022-07-07 MED ORDER — OXYCODONE HCL 5 MG/5ML PO SOLN: 5.0000 mg | Freq: Once | ORAL | Status: AC | PRN

## 2022-07-07 MED ORDER — MIDAZOLAM HCL 2 MG/2ML IJ SOLN
INTRAMUSCULAR | Status: DC | PRN
  Administered 2022-07-07: 2 mg via INTRAVENOUS

## 2022-07-07 MED ORDER — PHENYLEPHRINE 80 MCG/ML (10ML) SYRINGE FOR IV PUSH (FOR BLOOD PRESSURE SUPPORT)
PREFILLED_SYRINGE | INTRAVENOUS | Status: DC | PRN
  Administered 2022-07-07 (×2): 80 ug via INTRAVENOUS
  Administered 2022-07-07: 160 ug via INTRAVENOUS
  Administered 2022-07-07: 240 ug via INTRAVENOUS

## 2022-07-07 MED ORDER — OXYCODONE HCL 5 MG PO TABS
5.0000 mg | ORAL_TABLET | Freq: Once | ORAL | Status: AC | PRN
  Administered 2022-07-07: 5 mg via ORAL

## 2022-07-07 MED ORDER — MIDAZOLAM HCL 2 MG/2ML IJ SOLN
INTRAMUSCULAR | Status: AC
  Filled 2022-07-07: qty 2

## 2022-07-07 MED ORDER — ROCURONIUM BROMIDE 10 MG/ML (PF) SYRINGE
PREFILLED_SYRINGE | INTRAVENOUS | Status: DC | PRN
  Administered 2022-07-07: 10 mg via INTRAVENOUS
  Administered 2022-07-07: 50 mg via INTRAVENOUS

## 2022-07-07 MED ORDER — PHENYLEPHRINE HCL-NACL 20-0.9 MG/250ML-% IV SOLN
INTRAVENOUS | Status: DC | PRN
  Administered 2022-07-07: 30 ug/min via INTRAVENOUS

## 2022-07-07 MED ORDER — FENTANYL CITRATE (PF) 250 MCG/5ML IJ SOLN
INTRAMUSCULAR | Status: AC
  Filled 2022-07-07: qty 5

## 2022-07-07 SURGICAL SUPPLY — 46 items
ADH SKN CLS APL DERMABOND .7 (GAUZE/BANDAGES/DRESSINGS) ×1
APL PRP STRL LF DISP 70% ISPRP (MISCELLANEOUS) ×1
APPLIER CLIP 5 13 M/L LIGAMAX5 (MISCELLANEOUS) ×1
APR CLP MED LRG 5 ANG JAW (MISCELLANEOUS) ×1
BAG COUNTER SPONGE SURGICOUNT (BAG) ×1 IMPLANT
BAG SPEC RTRVL 10 TROC 200 (ENDOMECHANICALS) ×1
BAG SPNG CNTER NS LX DISP (BAG) ×1
BLADE CLIPPER SURG (BLADE) IMPLANT
CANISTER SUCT 3000ML PPV (MISCELLANEOUS) ×1 IMPLANT
CHLORAPREP W/TINT 26 (MISCELLANEOUS) ×1 IMPLANT
CLIP APPLIE 5 13 M/L LIGAMAX5 (MISCELLANEOUS) ×1 IMPLANT
COVER SURGICAL LIGHT HANDLE (MISCELLANEOUS) ×1 IMPLANT
DERMABOND ADVANCED .7 DNX12 (GAUZE/BANDAGES/DRESSINGS) ×1 IMPLANT
DRSG DERMACEA 8X12 NADH (GAUZE/BANDAGES/DRESSINGS) IMPLANT
ELECT REM PT RETURN 9FT ADLT (ELECTROSURGICAL) ×1
ELECTRODE REM PT RTRN 9FT ADLT (ELECTROSURGICAL) ×1 IMPLANT
GLOVE BIO SURGEON STRL SZ8 (GLOVE) ×1 IMPLANT
GLOVE BIOGEL PI IND STRL 8 (GLOVE) ×1 IMPLANT
GOWN STRL REUS W/ TWL LRG LVL3 (GOWN DISPOSABLE) ×2 IMPLANT
GOWN STRL REUS W/ TWL XL LVL3 (GOWN DISPOSABLE) ×1 IMPLANT
GOWN STRL REUS W/TWL LRG LVL3 (GOWN DISPOSABLE) ×2
GOWN STRL REUS W/TWL XL LVL3 (GOWN DISPOSABLE) ×1
KIT BASIN OR (CUSTOM PROCEDURE TRAY) ×1 IMPLANT
KIT TURNOVER KIT B (KITS) ×1 IMPLANT
L-HOOK LAP DISP 36CM (ELECTROSURGICAL) ×1
LHOOK LAP DISP 36CM (ELECTROSURGICAL) ×1 IMPLANT
NDL 22X1.5 STRL (OR ONLY) (MISCELLANEOUS) ×1 IMPLANT
NEEDLE 22X1.5 STRL (OR ONLY) (MISCELLANEOUS) ×1 IMPLANT
NS IRRIG 1000ML POUR BTL (IV SOLUTION) ×1 IMPLANT
PAD ARMBOARD 7.5X6 YLW CONV (MISCELLANEOUS) ×1 IMPLANT
PENCIL BUTTON HOLSTER BLD 10FT (ELECTRODE) ×1 IMPLANT
POUCH RETRIEVAL ECOSAC 10 (ENDOMECHANICALS) ×1 IMPLANT
POUCH RETRIEVAL ECOSAC 10MM (ENDOMECHANICALS) ×1
SCISSORS LAP 5X35 DISP (ENDOMECHANICALS) ×1 IMPLANT
SET IRRIG TUBING LAPAROSCOPIC (IRRIGATION / IRRIGATOR) ×1 IMPLANT
SET TUBE SMOKE EVAC HIGH FLOW (TUBING) ×1 IMPLANT
SLEEVE ENDOPATH XCEL 5M (ENDOMECHANICALS) ×2 IMPLANT
SPECIMEN JAR SMALL (MISCELLANEOUS) ×1 IMPLANT
SUT VIC AB 4-0 PS2 27 (SUTURE) ×1 IMPLANT
TOWEL GREEN STERILE (TOWEL DISPOSABLE) ×1 IMPLANT
TOWEL GREEN STERILE FF (TOWEL DISPOSABLE) ×1 IMPLANT
TRAY LAPAROSCOPIC MC (CUSTOM PROCEDURE TRAY) ×1 IMPLANT
TROCAR XCEL BLUNT TIP 100MML (ENDOMECHANICALS) ×1 IMPLANT
TROCAR Z-THREAD OPTICAL 5X100M (TROCAR) ×1 IMPLANT
WARMER LAPAROSCOPE (MISCELLANEOUS) ×1 IMPLANT
WATER STERILE IRR 1000ML POUR (IV SOLUTION) ×1 IMPLANT

## 2022-07-07 NOTE — Anesthesia Preprocedure Evaluation (Signed)
Anesthesia Evaluation  Patient identified by MRN, date of birth, ID band Patient awake    Reviewed: Allergy & Precautions, H&P , NPO status , Patient's Chart, lab work & pertinent test results  Airway Mallampati: II   Neck ROM: full    Dental   Pulmonary neg pulmonary ROS   breath sounds clear to auscultation       Cardiovascular hypertension,  Rhythm:regular Rate:Normal     Neuro/Psych    GI/Hepatic   Endo/Other  diabetes, Type 2    Renal/GU      Musculoskeletal  (+) Arthritis ,    Abdominal   Peds  Hematology   Anesthesia Other Findings   Reproductive/Obstetrics H/o breast CA                             Anesthesia Physical Anesthesia Plan  ASA: 2  Anesthesia Plan: General   Post-op Pain Management:    Induction: Intravenous  PONV Risk Score and Plan: 3 and Ondansetron, Dexamethasone, Midazolam and Treatment may vary due to age or medical condition  Airway Management Planned: Oral ETT  Additional Equipment:   Intra-op Plan:   Post-operative Plan: Extubation in OR  Informed Consent: I have reviewed the patients History and Physical, chart, labs and discussed the procedure including the risks, benefits and alternatives for the proposed anesthesia with the patient or authorized representative who has indicated his/her understanding and acceptance.     Dental advisory given  Plan Discussed with: CRNA, Anesthesiologist and Surgeon  Anesthesia Plan Comments:        Anesthesia Quick Evaluation

## 2022-07-07 NOTE — ED Provider Notes (Signed)
[facility] EMERGENCY DEPARTMENT Provider Note   CSN: [id number] Arrival date & time: 07/07/22  7253     History  Chief Complaint  Patient presents with   Abdominal Pain    Carol Ward is a 61 y.o. female.   Abdominal Pain    Patient presents due to right upper quadrant abdominal pain.  She had an abnormal ultrasound in our office by the GI doctor yesterday which was concerning for cholecystitis noted to the ED for additional evaluation.  She is n.p.o. as of this morning, denies any chest pain or shortness of breath.  Not on blood thinners.  Home Medications Prior to Admission medications   Medication Sig Start Date End Date Taking? Authorizing Provider  dorzolamide-timolol (COSOPT) 22.3-6.8 MG/ML ophthalmic solution  03/18/15   [provider]  fluticasone (FLONASE) 50 MCG/ACT nasal spray Place 2 sprays into both nostrils daily. Patient not taking: Reported on 10/16/2016 02/26/16   Vivi Barrack, MD  latanoprost Ivin Poot) 0.005 % ophthalmic solution  03/18/15   [provider]  lisinopril (PRINIVIL,ZESTRIL) 10 MG tablet Take 1.5 tablets (15 mg total) by mouth daily. 09/01/18   Daisy Floro, DO  loratadine (CLARITIN) 10 MG tablet Take 1 tablet (10 mg total) by mouth daily. 04/28/18   Daisy Floro, DO  lovastatin (MEVACOR) 40 MG tablet Take 1 tablet (40 mg total) by mouth at bedtime. 04/28/18   Daisy Floro, DO  naproxen sodium (ANAPROX) 275 MG tablet TAKE ONE TABLET BY MOUTH TWICE A DAY AS NEEDED Patient not taking: Reported on 04/28/2018 11/30/16   Mayo, Pete Pelt, MD  zolpidem (AMBIEN) 5 MG tablet TAKE ONE TABLET BY MOUTH EVERY NIGHT AT BEDTIME 08/26/18   Daisy Floro, DO      Allergies    Patient has no known allergies.    Review of Systems   Review of Systems  Gastrointestinal:  Positive for abdominal pain.    Physical Exam Updated Vital Signs BP 113/63 (BP Location: Left Arm)   Pulse 75   Temp (!) 97.5 F (36.4  C)   Resp 17   LMP 02/05/2009   SpO2 95%  Physical Exam Vitals and nursing note reviewed. Exam conducted with a chaperone present.  Constitutional:      Appearance: Normal appearance.  HENT:     Head: Normocephalic and atraumatic.  Eyes:     General: No scleral icterus.       Right eye: No discharge.        Left eye: No discharge.     Extraocular Movements: Extraocular movements intact.     Pupils: Pupils are equal, round, and reactive to light.  Cardiovascular:     Rate and Rhythm: Normal rate and regular rhythm.     Pulses: Normal pulses.     Heart sounds: Normal heart sounds. No murmur heard.    No friction rub. No gallop.  Pulmonary:     Effort: Pulmonary effort is normal. No respiratory distress.     Breath sounds: Normal breath sounds.  Abdominal:     General: Abdomen is flat. Bowel sounds are normal. There is no distension.     Palpations: Abdomen is soft.     Tenderness: There is abdominal tenderness in the right upper quadrant. Positive signs include Murphy's sign.  Skin:    General: Skin is warm and dry.     Coloration: Skin is not jaundiced.  Neurological:     Mental Status: She is alert. Mental status  is at baseline.     Coordination: Coordination normal.     ED Results / Procedures / Treatments   Labs (all labs ordered are listed, but only abnormal results are displayed) Labs Reviewed  COMPREHENSIVE METABOLIC PANEL - Abnormal; Notable for the following components:      Result Value   Potassium 3.2 (*)    All other components within normal limits  URINALYSIS, ROUTINE W REFLEX MICROSCOPIC - Abnormal; Notable for the following components:   Color, Urine STRAW (*)    Specific Gravity, Urine 1.004 (*)    Leukocytes,Ua TRACE (*)    Bacteria, UA RARE (*)    All other components within normal limits  CBC WITH DIFFERENTIAL/PLATELET  LIPASE, BLOOD    EKG None  Radiology No results found.  Procedures Procedures    Medications Ordered in  ED Medications  cefTRIAXone (ROCEPHIN) 2 g in sodium chloride 0.9 % 100 mL IVPB (2 g Intravenous New Bag/Given 07/07/22 1258)    ED Course/ Medical Decision Making/ A&P                           Medical Decision Making Amount and/or Complexity of Data Reviewed Labs: ordered.   Patient presents due to right upper quadrant abdominal pain and abnormal ultrasound.  Concern for cholecystitis.  I ordered and reviewed laboratory workup.  I interpreted labs.  No leukocytosis or anemia, no transaminitis, lipase within normal limits and trace leukocyturia in the UA.  Ultrasound was obtained yesterday, I viewed those results and I do not think we need to repeat them.  US abdomen limited 07/06/22: 1. Heterogeneous gallbladder wall thickening, distention, cholelithiasis, sludge, and pericholecystic edema, raising concern for acute cholecystitis. Recommend surgical consultation.  2. Findings consistent with adenomyomatosis of the gallbladder   Consulted general surgery who will evaluate the patient but likely proceed to the OR.        Final Clinical Impression(s) / ED Diagnoses Final diagnoses:  None    Rx / DC Orders ED Discharge Orders     None         Sherrill Raring, [place] 07/07/22 1316    Valarie Merino, MD 07/07/22 [phone]

## 2022-07-07 NOTE — Op Note (Signed)
  07/07/2022  2:44 PM  PATIENT:  Carol Ward  61 y.o. female  PRE-OPERATIVE DIAGNOSIS:  CHOLECYSTITIS  POST-OPERATIVE DIAGNOSIS:  CHOLECYSTITIS WITH HYDROPS  PROCEDURE:  Procedure(s): LAPAROSCOPIC CHOLECYSTECTOMY  SURGEON:  Surgeon(s): Georganna Skeans, MD  ASSISTANTS: Saverio Danker, PA-C   ANESTHESIA:   local and general  EBL:  Total I/O In: -  Out: 10 [Blood:10]  BLOOD ADMINISTERED:none  DRAINS: none   SPECIMEN:  Excision  DISPOSITION OF SPECIMEN:  PATHOLOGY  COUNTS:  YES  DICTATION: .Dragon Dictation Findings: Acute cholecystitis with hydrops of the gallbladder  Procedure in detail: Informed consent was obtained.  She received intravenous antibiotics.  She was brought to the operating room and general endotracheal anesthesia was administered by the anesthesia staff.  Her abdomen was prepped and draped in a sterile fashion.  A timeout procedure was performed.The infraumbilical region was infiltrated with local. Infraumbilical incision was made. Subcutaneous tissues were dissected down revealing the anterior fascia. This was divided sharply along the midline. Peritoneal cavity was entered under direct vision without complication. A 0 Vicryl pursestring was placed around the fascial opening. Hassan trocar was inserted into the abdomen. The abdomen was insufflated with carbon dioxide in standard fashion. Under direct vision a 5 mm epigastric and 5 mm right abdominal port x 2 were placed.  Laparoscopic exploration revealed a tensely distended and thick-walled gallbladder.  This was drained with the Nahzat aspirator revealing clear bile consistent with hydrops of the gallbladder.  Once the gallbladder was decompressed, the dome was retracted superior and medially.  The body had a lot of filmy adhesions down to the omentum which were taken down with cautery.  This revealed the body and infundibulum.  Further adhesions were taken down sweeping the duodenum away from the infundibulum.   I began dissection laterally and progressed medially identifying the cystic artery and the cystic duct.  The cystic duct was quite short.  The cystic duct common duct junction was easily visualized.  Dissection continued until we had a good critical view of safety.  2 clips were placed proximal on the cystic artery and 1 was placed distally and it was divided.  The cystic duct was then clipped 3 times proximally, once distally and divided.  The gallbladder was taken off the liver bed using Bovie cautery and achieving good hemostasis.  The gallbladder was placed in a bag and removed from the abdomen and sent to pathology.  The liver bed was copiously irrigated and hemostasis was obtained with cautery.  The liver bed was then dried.  Irrigation fluid was evacuated.  Clips were reinspected and remained in good position and there was no bleeding.  The area was dry.  Ports were removed under direct vision.  Pneumoperitoneum was released.  Infraumbilical fascia was closed by tying the pursestring.  All 4 wounds were irrigated and the skin of each was closed with 4-0 Monocryl followed by Dermabond.  All counts were correct.  She tolerated the procedure well without apparent complication and was taken recovery in stable condition.  PATIENT DISPOSITION:  PACU - hemodynamically stable.   Delay start of Pharmacological VTE agent (>24hrs) due to surgical blood loss or risk of bleeding:  no  Georganna Skeans, MD, MPH, FACS Pager: [phone]  12/12/20232:44 PM

## 2022-07-07 NOTE — ED Notes (Signed)
Patient changed into gown and all belongings placed in bags and sent with patient's husband prior to leaving department.

## 2022-07-07 NOTE — H&P (Signed)
Carol Ward is an 60 y.o. female.   Chief Complaint: RUQ pain HPI: [name] with PMHx DM, breast cancer, HTN, and glaucoma first had an episode of right upper quadrant pain about 3 weeks ago.  She said it lasted for a couple of days but resolved spontaneously.  It returned recently and she underwent a right upper quadrant ultrasound ordered by her PCP.  She went to their office this morning to see her PCP and a GI doctor but was instead directed to the emergency department due to the results of her ultrasound.  This showed multiple gallstones and signs of acute cholecystitis.  She continues to have some right upper quadrant pain.  Past Medical History:  Diagnosis Date   Breast cancer, right breast [facility]) June 2010   Poorly differentiated intraductal carcinoma. s/p chemo, radiation and lumpectomy. Triple recepetro negative. BRAC1 adn BRCA2 negative.   Diabetes mellitus, type II ([place]) 57/12   A1c 7.5    Hyperlipidemia 6/31/2011   Taking Simvistatin. LDL 217--127-107   Hypertension 2005   Treated with ACE ([facility] in [place])   Lump or mass in breast    Osteoarthritis of shoulder region 01/29/2011   Personal history of chemotherapy    Personal history of radiation therapy     Past Surgical History:  Procedure Laterality Date   BREAST LUMPECTOMY  Jan 2011   R breast     History reviewed. No pertinent family history. Social History:  reports that she has never smoked. She has never used smokeless tobacco. She reports that she does not drink alcohol and does not use drugs.  Allergies: No Known Allergies  (Not in a hospital admission)   Results for orders placed or performed during the hospital encounter of 07/07/22 (from the past 48 hour(s))  CBC with Differential     Status: None   Collection Time: 07/07/22 10:30 AM  Result Value Ref Range   WBC 8.3 4.0 - 10.5 K/uL   RBC 5.01 3.87 - 5.11 MIL/uL   Hemoglobin 14.6 12.0 - 15.0 g/dL   HCT 42.2 36.0 - 46.0 %   MCV 84.2 80.0 - 100.0 fL    MCH 29.1 26.0 - 34.0 pg   MCHC 34.6 30.0 - 36.0 g/dL   RDW 12.4 11.5 - 15.5 %   Platelets 305 150 - 400 K/uL   nRBC 0.0 0.0 - 0.2 %   Neutrophils Relative % 61 %   Neutro Abs 5.0 1.7 - 7.7 K/uL   Lymphocytes Relative 23 %   Lymphs Abs 1.9 0.7 - 4.0 K/uL   Monocytes Relative 9 %   Monocytes Absolute 0.8 0.1 - 1.0 K/uL   Eosinophils Relative 6 %   Eosinophils Absolute 0.5 0.0 - 0.5 K/uL   Basophils Relative 1 %   Basophils Absolute 0.1 0.0 - 0.1 K/uL   Immature Granulocytes 0 %   Abs Immature Granulocytes 0.03 0.00 - 0.07 K/uL    Comment: Performed at [facility], [address] [place], [place] [address]  Comprehensive metabolic panel     Status: Abnormal   Collection Time: 07/07/22 10:30 AM  Result Value Ref Range   Sodium 140 135 - 145 mmol/L   Potassium 3.2 (L) 3.5 - 5.1 mmol/L   Chloride 103 98 - 111 mmol/L   CO2 26 22 - 32 mmol/L   Glucose, Bld 96 70 - 99 mg/dL    Comment: Glucose reference range applies only to samples taken after fasting for at least 8 hours.  BUN 9 8 - 23 mg/dL   Creatinine, Ser 0.87 0.44 - 1.00 mg/dL   Calcium 9.6 8.9 - 10.3 mg/dL   Total Protein 7.3 6.5 - 8.1 g/dL   Albumin 4.0 3.5 - 5.0 g/dL   AST 19 15 - 41 U/L   ALT 18 0 - 44 U/L   Alkaline Phosphatase 66 38 - 126 U/L   Total Bilirubin 0.4 0.3 - 1.2 mg/dL   GFR, Estimated >60 >60 mL/min    Comment: (NOTE) Calculated using the CKD-EPI Creatinine Equation (2021)    Anion gap 11 5 - 15    Comment: Performed at [facility] [address] [place], [place] [address]  Lipase, blood     Status: None   Collection Time: 07/07/22 10:30 AM  Result Value Ref Range   Lipase 31 11 - 51 U/L    Comment: Performed at [facility] [address], [place], [place] [address]  Urinalysis, Routine w reflex microscopic     Status: Abnormal   Collection Time: 07/07/22 11:05 AM  Result Value Ref Range   Color, Urine STRAW (A) YELLOW   APPearance CLEAR CLEAR   Specific Gravity, Urine  1.004 (L) 1.005 - 1.030   pH 7.0 5.0 - 8.0   Glucose, UA NEGATIVE NEGATIVE mg/dL   Hgb urine dipstick NEGATIVE NEGATIVE   Bilirubin Urine NEGATIVE NEGATIVE   Ketones, ur NEGATIVE NEGATIVE mg/dL   Protein, ur NEGATIVE NEGATIVE mg/dL   Nitrite NEGATIVE NEGATIVE   Leukocytes,Ua TRACE (A) NEGATIVE   RBC / HPF 0-5 0 - 5 RBC/hpf   WBC, UA 0-5 0 - 5 WBC/hpf   Bacteria, UA RARE (A) NONE SEEN   Squamous Epithelial / LPF 0-5 0 - 5    Comment: Performed at [facility], [address] [place], [place] [address]   No results found.  Review of Systems  Constitutional:  Positive for appetite change.  HENT: Negative.    Eyes: Negative.   Respiratory: Negative.    Cardiovascular: Negative.   Gastrointestinal:  Positive for abdominal pain and nausea. Negative for blood in stool.  Endocrine: Negative.   Genitourinary: Negative.   Musculoskeletal: Negative.   Allergic/Immunologic: Negative.   Neurological: Negative.   Hematological: Negative.   Psychiatric/Behavioral: Negative.      Blood pressure 113/63, pulse 75, temperature (!) 97.5 F (36.4 C), resp. rate 17, last menstrual period 02/05/2009, SpO2 95 %. Physical Exam Eyes:     Extraocular Movements: Extraocular movements intact.     Pupils: Pupils are equal, round, and reactive to light.  Cardiovascular:     Rate and Rhythm: Normal rate and regular rhythm.     Heart sounds: Normal heart sounds.  Pulmonary:     Breath sounds: Normal breath sounds.  Abdominal:     General: Abdomen is flat.     Palpations: Abdomen is soft.     Tenderness: There is abdominal tenderness in the right upper quadrant. There is no guarding or rebound.     Comments: Mild RUQ tenderness  Skin:    General: Skin is warm and dry.     Capillary Refill: Capillary refill takes less than 2 seconds.  Neurological:     Mental Status: She is alert and oriented to person, place, and time.  Psychiatric:        Mood and Affect: Mood normal.       Assessment/Plan Acute cholecystitis -IV Rocephin, will proceed with laparoscopic cholecystectomy today. I discussed the  procedure, risks, and benefits with her and her husband. She is agreeable. Her husband reports he is familiar with the procedure as he underwent a lap chole in the past.  Zenovia Jarred, MD 07/07/2022, 12:35 PM

## 2022-07-07 NOTE — Anesthesia Procedure Notes (Signed)
Procedure Name: Intubation Date/Time: 07/07/2022 1:37 PM  Performed by: Lorie Phenix, CRNAPre-anesthesia Checklist: Patient identified, Emergency Drugs available, Suction available and Patient being monitored Patient Re-evaluated:Patient Re-evaluated prior to induction Oxygen Delivery Method: Circle system utilized Preoxygenation: Pre-oxygenation with 100% oxygen Induction Type: IV induction Ventilation: Mask ventilation without difficulty Laryngoscope Size: Mac and 3 Grade View: Grade I Tube type: Oral Tube size: 7.0 mm Number of attempts: 1 Airway Equipment and Method: Stylet and Oral airway Placement Confirmation: ETT inserted through vocal cords under direct vision, positive ETCO2 and breath sounds checked- equal and bilateral Secured at: 22 cm Tube secured with: Tape Dental Injury: Teeth and Oropharynx as per pre-operative assessment

## 2022-07-07 NOTE — Discharge Instructions (Signed)
[facility]  Please arrive at least 30 min before your appointment to complete your check in paperwork.  If you are unable to arrive 30 min prior to your appointment time we may have to cancel or reschedule you. LAPAROSCOPIC SURGERY: POST OP INSTRUCTIONS Always review your discharge instruction sheet given to you by the facility where your surgery was performed. IF YOU HAVE DISABILITY OR FAMILY LEAVE FORMS, YOU MUST BRING THEM TO THE OFFICE FOR PROCESSING.   DO NOT GIVE THEM TO YOUR DOCTOR.  PAIN CONTROL  First take acetaminophen (Tylenol) AND/or ibuprofen (Advil) to control your pain after surgery.  Follow directions on package.  Taking acetaminophen (Tylenol) and/or ibuprofen (Advil) regularly after surgery will help to control your pain and lower the amount of prescription pain medication you may need.  You should not take more than 4,000 mg (4 grams) of acetaminophen (Tylenol) in 24 hours.  You should not take ibuprofen (Advil), aleve, motrin, naprosyn or other NSAIDS if you have a history of stomach ulcers or chronic kidney disease.  A prescription for pain medication may be given to you upon discharge.  Take your pain medication as prescribed, if you still have uncontrolled pain after taking acetaminophen (Tylenol) or ibuprofen (Advil). Use ice packs to help control pain. If you need a refill on your pain medication, please contact your pharmacy.  They will contact our office to request authorization. Prescriptions will not be filled after 5pm or on week-ends.  HOME MEDICATIONS Take your usually prescribed medications unless otherwise directed.  DIET You should follow a light diet the first few days after arrival home.  Be sure to include lots of fluids daily. Avoid fatty, fried foods.   CONSTIPATION It is common to experience some constipation after surgery and if you are taking pain medication.  Increasing fluid intake and taking a stool softener (such as Colace)  will usually help or prevent this problem from occurring.  A mild laxative (Milk of Magnesia or Miralax) should be taken according to package instructions if there are no bowel movements after 48 hours.  WOUND/INCISION CARE Most patients will experience some swelling and bruising in the area of the incisions.  Ice packs will help.  Swelling and bruising can take several days to resolve.  Unless discharge instructions indicate otherwise, follow guidelines below  STERI-STRIPS - you may remove your outer bandages 48 hours after surgery, and you may shower at that time.  You have steri-strips (small skin tapes) in place directly over the incision.  These strips should be left on the skin for 7-10 days.   DERMABOND/SKIN GLUE - you may shower in 24 hours.  The glue will flake off over the next 2-3 weeks. Any sutures or staples will be removed at the office during your follow-up visit.  ACTIVITIES You may resume regular (light) daily activities beginning the next day--such as daily self-care, walking, climbing stairs--gradually increasing activities as tolerated.  You may have sexual intercourse when it is comfortable.  Refrain from any heavy lifting or straining until approved by your doctor. You may drive when you are no longer taking prescription pain medication, you can comfortably wear a seatbelt, and you can safely maneuver your car and apply brakes.  FOLLOW-UP You should see your doctor in the office for a follow-up appointment approximately 2-3 weeks after your surgery.  You should have been given your post-op/follow-up appointment when your surgery was scheduled.  If you did not receive a post-op/follow-up appointment, make sure   that you call for this appointment within a day or two after you arrive home to insure a convenient appointment time.   WHEN TO CALL YOUR DOCTOR: Fever over 101.0 Inability to urinate Continued bleeding from incision. Increased pain, redness, or drainage from the  incision. Increasing abdominal pain  The clinic staff is available to answer your questions during regular business hours.  Please don't hesitate to call and ask to speak to one of the nurses for clinical concerns.  If you have a medical emergency, go to the nearest emergency room or call 911.  A surgeon from [facility] is always on call at the hospital. [address], [address], [place], [place]  [address] ? [address] [address], [place], [place]   [address] [phone] ? [phone] ? FAX [phone]  

## 2022-07-07 NOTE — ED Provider Triage Note (Signed)
Emergency Medicine Provider Triage Evaluation Note  Carol Ward , a 61 y.o. female  was evaluated in triage.  Pt complains of abdominal pain.  Had ultrasound done yesterday which is concerning for cholecystitis, told to go to the ED.  Has not eaten anything today.  US abdomen limited 07/06/22: 1. Heterogeneous gallbladder wall thickening, distention, cholelithiasis, sludge, and pericholecystic edema, raising concern for acute cholecystitis. Recommend surgical consultation.  2. Findings consistent with adenomyomatosis of the gallbladder   Review of Systems  Per HPI  Physical Exam  BP 133/75 (BP Location: Left Arm)   Pulse 76   Temp 98 F (36.7 C) (Oral)   Resp 16   LMP 02/05/2009   SpO2 92%  Gen:   Awake, no distress   Resp:  Normal effort  MSK:   Moves extremities without difficulty  Other:  +murphy   Medical Decision Making  Medically screening exam initiated at 10:20 AM.  Appropriate orders placed.  Carol Ward was informed that the remainder of the evaluation will be completed by another provider, this initial triage assessment does not replace that evaluation, and the importance of remaining in the ED until their evaluation is complete.  Do not feel need to repeat ultrasound.  Patient is NPO.  Will check labs and keep patient n.p.o. until on room when surgery can be paged.   Sherrill Raring, PA-C 07/07/22 1022

## 2022-07-07 NOTE — Transfer of Care (Signed)
Immediate Anesthesia Transfer of Care Note  Patient: Carol Ward  Procedure(s) Performed: LAPAROSCOPIC CHOLECYSTECTOMY  Patient Location: PACU  Anesthesia Type:General  Level of Consciousness: awake  Airway & Oxygen Therapy: Patient Spontanous Breathing  Post-op Assessment: Report given to RN and Post -op Vital signs reviewed and stable  Post vital signs: Reviewed and stable  Last Vitals:  Vitals Value Taken Time  BP 149/81 07/07/22 1500  Temp 36.7 C 07/07/22 1455  Pulse 82 07/07/22 1509  Resp 17 07/07/22 1509  SpO2 98 % 07/07/22 1509  Vitals shown include unvalidated device data.  Last Pain:  Vitals:   07/07/22 1455  TempSrc:   PainSc: 0-No pain         Complications: No notable events documented.

## 2022-07-07 NOTE — ED Notes (Signed)
Report called to short stay RN. Will transport patient at this time.

## 2022-07-07 NOTE — ED Triage Notes (Signed)
Pt arrived POV from home c/o RUQ abdominal pain. Pt states she had a ultrasound down yesterday and was called and told to come immediately to have her gallbladder removed. Pt has stones.

## 2022-07-08 ENCOUNTER — Encounter (HOSPITAL_COMMUNITY): Payer: Self-pay | Admitting: General Surgery

## 2022-07-08 LAB — SURGICAL PATHOLOGY

## 2022-07-08 NOTE — Anesthesia Postprocedure Evaluation (Signed)
Anesthesia Post Note  Patient: Carol Ward  Procedure(s) Performed: LAPAROSCOPIC CHOLECYSTECTOMY     Patient location during evaluation: PACU Anesthesia Type: General Level of consciousness: awake and alert Pain management: pain level controlled Vital Signs Assessment: post-procedure vital signs reviewed and stable Respiratory status: spontaneous breathing, nonlabored ventilation, respiratory function stable and patient connected to nasal cannula oxygen Cardiovascular status: blood pressure returned to baseline and stable Postop Assessment: no apparent nausea or vomiting Anesthetic complications: no   No notable events documented.  Last Vitals:  Vitals:   07/07/22 1530 07/07/22 1545  BP: 132/78 130/79  Pulse: 81 80  Resp: 20 18  Temp:  36.7 C  SpO2: 94% 94%    Last Pain:  Vitals:   07/07/22 1530  TempSrc:   PainSc: [place]

## 2023-07-28 NOTE — Progress Notes (Signed)
 Office Visit Note  Patient: Carol Ward             Date of Birth: [birth year]           MRN: [id number]             PCP: Audria Leather, MD Referring: Audria Leather, MD Visit Date: 08/11/2023 Occupation: @GUAROCC @  Subjective:  Pain in multiple joints  History of Present Illness: Carol Ward is a 63 y.o. female seen in consultation per request of her PCP.  According the patient her symptoms started many years ago.  She states about 20 years ago she was told that she had spinal stenosis while she was living in [place].  She states she has had lower back pain since then which has been worse in the last 3 years.  She has been going to pain management where she gets epidural injections.  She has left-sided radiculopathy and sometimes the pain radiates into her right lower extremity.  She also experiences numbness in the left foot and has balance issues.  She had a fall about 4 years ago and had left knee joint pain which has been intermittent.  She has been experiencing discomfort in her right shoulder for the last 1 month.  She also experiences numbness and discomfort in her right third and fourth fingers.  She complains of pain in her bilateral hip joints and intermittent discomfort in her left shin.  She has balance issues when she walks.  She gives history of dry mouth and dry eyes.  There is no history of malar rash, photosensitivity, Raynaud's or lymphadenopathy.  This family history of osteoarthritis.  She is gravida 2, para 1, miscarriage 1.  There is no history of toxemia or DVTs.  She is non-smoker and does not drink any alcohol.  She is unemployed.  She enjoys cooking and walking.    Activities of Daily Living:  Patient reports morning stiffness for 0  none .   Patient Denies nocturnal pain.  Difficulty dressing/grooming: Denies Difficulty climbing stairs: Reports Difficulty getting out of chair: Denies Difficulty using hands for taps, buttons, cutlery, and/or writing:  Denies  Review of Systems  Constitutional:  Negative for fatigue.  HENT:  Positive for mouth dryness and sore tongue. Negative for mouth sores.   Eyes:  Positive for dryness.  Respiratory:  Negative for shortness of breath.   Cardiovascular:  Positive for palpitations. Negative for chest pain.  Gastrointestinal:  Positive for blood in stool and constipation. Negative for diarrhea.  Endocrine: Negative for increased urination.  Genitourinary:  Negative for involuntary urination.  Musculoskeletal:  Positive for joint pain, gait problem, joint pain, joint swelling, muscle weakness and muscle tenderness. Negative for myalgias, morning stiffness and myalgias.  Skin:  Positive for hair loss. Negative for color change, rash and sensitivity to sunlight.  Allergic/Immunologic: Positive for susceptible to infections.  Neurological:  Positive for headaches. Negative for dizziness.  Hematological:  Negative for swollen glands.  Psychiatric/Behavioral:  Negative for depressed mood and sleep disturbance. The patient is not nervous/anxious.     PMFS History:  Patient Active Problem List   Diagnosis Date Noted   Allergies 04/28/2018   Dyslipidemia 03/11/2018   Post-menopausal bleeding 04/14/2017   Allergic rhinitis 02/26/2016   Periodic health assessment, general screening, adult 01/12/2014   Lymphedema Right arm and right breast 07/24/2013   Chronic closed-angle glaucoma 07/12/2012   HTN (hypertension) 12/11/2010   Hyperlipidemia 12/11/2010   History of breast cancer 12/11/2010  Past Medical History:  Diagnosis Date   Breast cancer, right breast [facility]) June 2010   Poorly differentiated intraductal carcinoma. s/p chemo, radiation and lumpectomy. Triple recepetro negative. BRAC1 adn BRCA2 negative.   Diabetes mellitus, type II (HCC) 57/12   A1c 7.5    Hyperlipidemia 6/31/2011   Taking Simvistatin. LDL 217--127-107   Hypertension 2005   Treated with ACE ([facility] in [place])   Lump or mass  in breast    Osteoarthritis of shoulder region 01/29/2011   Personal history of chemotherapy    Personal history of radiation therapy     History reviewed. No pertinent family history. Past Surgical History:  Procedure Laterality Date   BREAST LUMPECTOMY  Jan 2011   R breast    CHOLECYSTECTOMY N/A 07/07/2022   Procedure: LAPAROSCOPIC CHOLECYSTECTOMY;  Surgeon: Dorena Gander, MD;  Location: [facility] OR;  Service: General;  Laterality: N/A;   Social History   Social History Narrative   Moved to [place] 7 mos ago.   Lives with husband [facility]) of 15 years and 87 yo son Jennaveve Salters).    Originally from [place].             Immunization History  Administered Date(s) Administered   Influenza Split 05/12/2012   Influenza,inj,Quad PF,6+ Mos 07/16/2015   Influenza-Unspecified 10/16/2016   Moderna Sars-Covid-2 Vaccination 10/18/2019, 11/15/2019, 05/03/2020   PFIZER Comirnaty(Gray Top)Covid-19 Tri-Sucrose Vaccine 08/02/2020   Tdap 10/16/2016     Objective: Vital Signs: BP 118/77 (BP Location: Left Arm, Patient Position: Sitting, Cuff Size: Normal)   Pulse 76   Resp 15   Ht 5\' 6"  (1.676 m)   Wt 177 lb (80.3 kg)   LMP 02/05/2009   BMI 28.57 kg/m    Physical Exam Vitals and nursing note reviewed.  Constitutional:      Appearance: She is well-developed.  HENT:     Head: Normocephalic and atraumatic.  Eyes:     Conjunctiva/sclera: Conjunctivae normal.  Cardiovascular:     Rate and Rhythm: Normal rate and regular rhythm.     Heart sounds: Normal heart sounds.  Pulmonary:     Effort: Pulmonary effort is normal.     Breath sounds: Normal breath sounds.  Abdominal:     General: Bowel sounds are normal.     Palpations: Abdomen is soft.  Musculoskeletal:     Cervical back: Normal range of motion.  Lymphadenopathy:     Cervical: No cervical adenopathy.  Skin:    General: Skin is warm and dry.     Capillary Refill: Capillary refill takes less than 2 seconds.   Neurological:     Mental Status: She is alert and oriented to person, place, and time.  Psychiatric:        Behavior: Behavior normal.      Musculoskeletal Exam: Cervical spine was in good range of motion.  Thoracic spine with good range of motion without any discomfort.  She had some limitation with forward flexion of the lumbar spine with discomfort.  Shoulder joints were in good range of motion.  She discomfort range of motion of her right shoulder over the subacromial region and over the deltoid insertion.  She also had discomfort with internal rotation of the right shoulder.  Left shoulder joint was in full range of motion.  Elbow joints were in good range of motion.  There was no synovitis of her wrist joints, MCPs PIPs or DIPs.  Mild PIP and DIP thickening was noted.  Hip joints with good range of  motion.  She has some tenderness over bilateral trochanteric bursa.  Knee joints in good range of motion without any warmth swelling or effusion.  There was no synovitis or swelling over ankles or MTPs or PIPs.  Left hip is higher than the right hip.  CDAI Exam: CDAI Score: -- Patient Global: --; Provider Global: -- Swollen: --; Tender: -- Joint Exam 08/11/2023   No joint exam has been documented for this visit   There is currently no information documented on the homunculus. Go to the Rheumatology activity and complete the homunculus joint exam.  Investigation: No additional findings.  Imaging: No results found.  Recent Labs: Lab Results  Component Value Date   WBC 8.3 07/07/2022   HGB 14.6 07/07/2022   PLT 305 07/07/2022   NA 140 07/07/2022   K 3.2 (L) 07/07/2022   CL 103 07/07/2022   CO2 26 07/07/2022   GLUCOSE 96 07/07/2022   BUN 9 07/07/2022   CREATININE 0.87 07/07/2022   BILITOT 0.4 07/07/2022   ALKPHOS 66 07/07/2022   AST 19 07/07/2022   ALT 18 07/07/2022   PROT 7.3 07/07/2022   ALBUMIN 4.0 07/07/2022   CALCIUM  9.6 07/07/2022   GFRAA >60 02/06/2019   January 20, 2022 hepatitis B negative Speciality Comments: No specialty comments available.  Procedures:  No procedures performed Allergies: Patient has no known allergies.   Assessment / Plan:     Visit Diagnoses: Polyarthralgia-patient complains of pain and discomfort in multiple joints over the years.  She denies any history of joint swelling.  Acute pain of right shoulder -patient states for the last 1 week she has been having pain and discomfort in her right shoulder.  She has difficulty raising her arm and also with internal rotation.  She has some tenderness over subacromial region and at the insertion of the deltoid.  Plan: XR Shoulder Right X-rays obtained today showed no significant joint space narrowing.  Humeral spurring was noted.  X-ray findings were reviewed with the patient.  I will refer her to physical therapy.  A handout on shoulder exercises was also given.  Pain in both hands-she complains of discomfort in her bilateral hands especially her right middle and ring finger.  DIP thickening was noted.  No synovitis was noted.  Clinical findings are consistent with osteoarthritis.  A handout on hand exercises was given.  Joint protection muscle strengthening was discussed.  Leg length discrepancy-patient says that her left hip is higher than the right.  She appears to have leg length discrepancy.  I will refer her to physical therapy for evaluation and possible shoe lift.  Chronic pain of left ankle -patient states that she has been followed by sports medicine for the left ankle discomfort.  July 09, 2023 x-rays unremarkable.  Evaluated by sports medicine  Spinal stenosis of lumbar region without neurogenic claudication - Left-sided radiculopathy. -Patient states that she was diagnosed with lumbar spine and spinal stenosis 20 years ago while she was living in [place].  She has had left-sided radiculopathy since then.  She is going to pain management where she gets epidural injections.  She  requested a lumbar spine x-ray today.  Plan: XR Lumbar Spine 2-3 Views.  X-rays showed lumbar spondylosis, spondylolisthesis and facet joint arthropathy.  X-ray findings were reviewed with the patient.  A handout on back exercises was given.  Poor balance-patient reports poor balance.  She has difficulty walking due to poor balance.  I will refer her to physical therapy.  Primary  hypertension-blood pressure was normal at 118/77 today.  Mixed hyperlipidemia  History of breast cancer - 2010, lumpectomy, CTX and RTX  Lymphedema Right arm and right breast  Chronic angle-closure glaucoma of right eye, indeterminate stage  Primary insomnia  Prediabetes  Osteoporosis screening - March 18, 2023 DEXA normal  Orders: Orders Placed This Encounter  Procedures   XR Shoulder Right   XR Lumbar Spine 2-3 Views   No orders of the defined types were placed in this encounter.    Follow-Up Instructions: Return in about 4 months (around 12/09/2023) for Osteoarthritis.   Nicholas Bari, MD  Note - This record has been created using Animal nutritionist.  Chart creation errors have been sought, but may not always  have been located. Such creation errors do not reflect on  the standard of medical care.

## 2023-08-11 ENCOUNTER — Telehealth: Payer: Self-pay | Admitting: *Deleted

## 2023-08-11 ENCOUNTER — Ambulatory Visit: Payer: Medicaid Other

## 2023-08-11 ENCOUNTER — Encounter: Payer: Self-pay | Admitting: Rheumatology

## 2023-08-11 ENCOUNTER — Ambulatory Visit: Payer: Medicaid Other | Attending: Rheumatology | Admitting: Rheumatology

## 2023-08-11 VITALS — BP 118/77 | HR 76 | Resp 15 | Ht 66.0 in | Wt 177.0 lb

## 2023-08-11 DIAGNOSIS — M25511 Pain in right shoulder: Secondary | ICD-10-CM

## 2023-08-11 DIAGNOSIS — M51369 Other intervertebral disc degeneration, lumbar region without mention of lumbar back pain or lower extremity pain: Secondary | ICD-10-CM

## 2023-08-11 DIAGNOSIS — M79641 Pain in right hand: Secondary | ICD-10-CM

## 2023-08-11 DIAGNOSIS — I890 Lymphedema, not elsewhere classified: Secondary | ICD-10-CM

## 2023-08-11 DIAGNOSIS — M2170 Unequal limb length (acquired), unspecified site: Secondary | ICD-10-CM

## 2023-08-11 DIAGNOSIS — G8929 Other chronic pain: Secondary | ICD-10-CM

## 2023-08-11 DIAGNOSIS — M48061 Spinal stenosis, lumbar region without neurogenic claudication: Secondary | ICD-10-CM

## 2023-08-11 DIAGNOSIS — I10 Essential (primary) hypertension: Secondary | ICD-10-CM

## 2023-08-11 DIAGNOSIS — M2550 Pain in unspecified joint: Secondary | ICD-10-CM | POA: Diagnosis not present

## 2023-08-11 DIAGNOSIS — R2689 Other abnormalities of gait and mobility: Secondary | ICD-10-CM

## 2023-08-11 DIAGNOSIS — M79642 Pain in left hand: Secondary | ICD-10-CM

## 2023-08-11 DIAGNOSIS — E782 Mixed hyperlipidemia: Secondary | ICD-10-CM

## 2023-08-11 DIAGNOSIS — M25572 Pain in left ankle and joints of left foot: Secondary | ICD-10-CM

## 2023-08-11 DIAGNOSIS — F5101 Primary insomnia: Secondary | ICD-10-CM

## 2023-08-11 DIAGNOSIS — Z13820 Encounter for screening for osteoporosis: Secondary | ICD-10-CM

## 2023-08-11 DIAGNOSIS — Z853 Personal history of malignant neoplasm of breast: Secondary | ICD-10-CM

## 2023-08-11 DIAGNOSIS — H402214 Chronic angle-closure glaucoma, right eye, indeterminate stage: Secondary | ICD-10-CM

## 2023-08-11 DIAGNOSIS — R7303 Prediabetes: Secondary | ICD-10-CM

## 2023-08-11 DIAGNOSIS — E785 Hyperlipidemia, unspecified: Secondary | ICD-10-CM

## 2023-08-11 NOTE — Patient Instructions (Addendum)
 Hand Exercises Hand exercises can be helpful for almost anyone. They can strengthen your hands and improve flexibility and movement. The exercises can also increase blood flow to the hands. These results can make your work and daily tasks easier for you. Hand exercises can be especially helpful for people who have joint pain from arthritis or nerve damage from using their hands over and over. These exercises can also help people who injure a hand. Exercises Most of these hand exercises are gentle stretching and motion exercises. It is usually safe to do them often throughout the day. Warming up your hands before exercise may help reduce stiffness. You can do this with gentle massage or by placing your hands in warm water for 10-15 minutes. It is normal to feel some stretching, pulling, tightness, or mild discomfort when you begin new exercises. In time, this will improve. Remember to always be careful and stop right away if you feel sudden, very bad pain or your pain gets worse. You want to get better and be safe. Ask your health care provider which exercises are safe for you. Do exercises exactly as told by your provider and adjust them as told. Do not begin these exercises until told by your provider. Knuckle bend or "claw" fist  Stand or sit with your arm, hand, and all five fingers pointed straight up. Make sure to keep your wrist straight. Gently bend your fingers down toward your palm until the tips of your fingers are touching your palm. Keep your big knuckle straight and only bend the small knuckles in your fingers. Hold this position for 10 seconds. Straighten your fingers back to your starting position. Repeat this exercise 5-10 times with each hand. Full finger fist  Stand or sit with your arm, hand, and all five fingers pointed straight up. Make sure to keep your wrist straight. Gently bend your fingers into your palm until the tips of your fingers are touching the middle of your  palm. Hold this position for 10 seconds. Extend your fingers back to your starting position, stretching every joint fully. Repeat this exercise 5-10 times with each hand. Straight fist  Stand or sit with your arm, hand, and all five fingers pointed straight up. Make sure to keep your wrist straight. Gently bend your fingers at the big knuckle, where your fingers meet your hand, and at the middle knuckle. Keep the knuckle at the tips of your fingers straight and try to touch the bottom of your palm. Hold this position for 10 seconds. Extend your fingers back to your starting position, stretching every joint fully. Repeat this exercise 5-10 times with each hand. Tabletop  Stand or sit with your arm, hand, and all five fingers pointed straight up. Make sure to keep your wrist straight. Gently bend your fingers at the big knuckle, where your fingers meet your hand, as far down as you can. Keep the small knuckles in your fingers straight. Think of forming a tabletop with your fingers. Hold this position for 10 seconds. Extend your fingers back to your starting position, stretching every joint fully. Repeat this exercise 5-10 times with each hand. Finger spread  Place your hand flat on a table with your palm facing down. Make sure your wrist stays straight. Spread your fingers and thumb apart from each other as far as you can until you feel a gentle stretch. Hold this position for 10 seconds. Bring your fingers and thumb tight together again. Hold this position for 10 seconds. Repeat  this exercise 5-10 times with each hand. Making circles  Stand or sit with your arm, hand, and all five fingers pointed straight up. Make sure to keep your wrist straight. Make a circle by touching the tip of your thumb to the tip of your index finger. Hold for 10 seconds. Then open your hand wide. Repeat this motion with your thumb and each of your fingers. Repeat this exercise 5-10 times with each hand. Thumb  motion  Sit with your forearm resting on a table and your wrist straight. Your thumb should be facing up toward the ceiling. Keep your fingers relaxed as you move your thumb. Lift your thumb up as high as you can toward the ceiling. Hold for 10 seconds. Bend your thumb across your palm as far as you can, reaching the tip of your thumb for the small finger (pinkie) side of your palm. Hold for 10 seconds. Repeat this exercise 5-10 times with each hand. Grip strengthening  Hold a stress ball or other soft ball in the middle of your hand. Slowly increase the pressure, squeezing the ball as much as you can without causing pain. Think of bringing the tips of your fingers into the middle of your palm. All of your finger joints should bend when doing this exercise. Hold your squeeze for 10 seconds, then relax. Repeat this exercise 5-10 times with each hand. Contact a health care provider if: Your hand pain or discomfort gets much worse when you do an exercise. Your hand pain or discomfort does not improve within 2 hours after you exercise. If you have either of these problems, stop doing these exercises right away. Do not do them again unless your provider says that you can. Get help right away if: You develop sudden, severe hand pain or swelling. If this happens, stop doing these exercises right away. Do not do them again unless your provider says that you can. This information is not intended to replace advice given to you by your health care provider. Make sure you discuss any questions you have with your health care provider. Document Revised: 07/28/2022 Document Reviewed: 07/28/2022 [employer] Patient Education  2024 [employer] Shoulder Exercises Ask your health care provider which exercises are safe for you. Do exercises exactly as told by your health care provider and adjust them as directed. It is normal to feel mild stretching, pulling, tightness, or discomfort as you do these exercises.  Stop right away if you feel sudden pain or your pain gets worse. Do not begin these exercises until told by your health care provider. Stretching exercises External rotation and abduction This exercise is sometimes called corner stretch. The exercise rotates your arm outward (external rotation) and moves your arm out from your body (abduction). Stand in a doorway with one of your feet slightly in front of the other. This is called a staggered stance. If you cannot reach your forearms to the door frame, stand facing a corner of a room. Choose one of the following positions as told by your health care provider: Place your hands and forearms on the door frame above your head. Place your hands and forearms on the door frame at the height of your head. Place your hands on the door frame at the height of your elbows. Slowly move your weight onto your front foot until you feel a stretch across your chest and in the front of your shoulders. Keep your head and chest upright and keep your abdominal muscles tight. Hold for __________  seconds. To release the stretch, shift your weight to your back foot. Repeat __________ times. Complete this exercise __________ times a day. Extension, standing  Stand and hold a broomstick, a cane, or a similar object behind your back. Your hands should be a little wider than shoulder-width apart. Your palms should face away from your back. Keeping your elbows straight and your shoulder muscles relaxed, move the stick away from your body until you feel a stretch in your shoulders (extension). Avoid shrugging your shoulders while you move the stick. Keep your shoulder blades tucked down toward the middle of your back. Hold for __________ seconds. Slowly return to the starting position. Repeat __________ times. Complete this exercise __________ times a day. Range-of-motion exercises Pendulum  Stand near a wall or a surface that you can hold onto for balance. Bend at the  waist and let your left / right arm hang straight down. Use your other arm to support you. Keep your back straight and do not lock your knees. Relax your left / right arm and shoulder muscles, and move your hips and your trunk so your left / right arm swings freely. Your arm should swing because of the motion of your body, not because you are using your arm or shoulder muscles. Keep moving your hips and trunk so your arm swings in the following directions, as told by your health care provider: Side to side. Forward and backward. In clockwise and counterclockwise circles. Continue each motion for __________ seconds, or for as long as told by your health care provider. Slowly return to the starting position. Repeat __________ times. Complete this exercise __________ times a day. Shoulder flexion, standing  Stand and hold a broomstick, a cane, or a similar object. Place your hands a little more than shoulder-width apart on the object. Your left / right hand should be palm-up, and your other hand should be palm-down. Keep your elbow straight and your shoulder muscles relaxed. Push the stick up with your healthy arm to raise your left / right arm in front of your body, and then over your head until you feel a stretch in your shoulder (flexion). Avoid shrugging your shoulder while you raise your arm. Keep your shoulder blade tucked down toward the middle of your back. Hold for __________ seconds. Slowly return to the starting position. Repeat __________ times. Complete this exercise __________ times a day. Shoulder abduction, standing  Stand and hold a broomstick, a cane, or a similar object. Place your hands a little more than shoulder-width apart on the object. Your left / right hand should be palm-up, and your other hand should be palm-down. Keep your elbow straight and your shoulder muscles relaxed. Push the object across your body toward your left / right side. Raise your left / right arm to the  side of your body (abduction) until you feel a stretch in your shoulder. Do not raise your arm above shoulder height unless your health care provider tells you to do that. If directed, raise your arm over your head. Avoid shrugging your shoulder while you raise your arm. Keep your shoulder blade tucked down toward the middle of your back. Hold for __________ seconds. Slowly return to the starting position. Repeat __________ times. Complete this exercise __________ times a day. Internal rotation  Place your left / right hand behind your back, palm-up. Use your other hand to dangle an exercise band, a broomstick, or a similar object over your shoulder. Grasp the band with your left / right  hand so you are holding on to both ends. Gently pull up on the band until you feel a stretch in the front of your left / right shoulder. The movement of your arm toward the center of your body is called internal rotation. Avoid shrugging your shoulder while you raise your arm. Keep your shoulder blade tucked down toward the middle of your back. Hold for __________ seconds. Release the stretch by letting go of the band and lowering your hands. Repeat __________ times. Complete this exercise __________ times a day. Strengthening exercises External rotation  Sit in a stable chair without armrests. Secure an exercise band to a stable object at elbow height on your left / right side. Place a soft object, such as a folded towel or a small pillow, between your left / right upper arm and your body to move your elbow about 4 inches (10 cm) away from your side. Hold the end of the exercise band so it is tight and there is no slack. Keeping your elbow pressed against the soft object, slowly move your forearm out, away from your abdomen (external rotation). Keep your body steady so only your forearm moves. Hold for __________ seconds. Slowly return to the starting position. Repeat __________ times. Complete this  exercise __________ times a day. Shoulder abduction  Sit in a stable chair without armrests, or stand up. Hold a __________ lb / kg weight in your left / right hand, or hold an exercise band with both hands. Start with your arms straight down and your left / right palm facing in, toward your body. Slowly lift your left / right hand out to your side (abduction). Do not lift your hand above shoulder height unless your health care provider tells you that this is safe. Keep your arms straight. Avoid shrugging your shoulder while you do this movement. Keep your shoulder blade tucked down toward the middle of your back. Hold for __________ seconds. Slowly lower your arm, and return to the starting position. Repeat __________ times. Complete this exercise __________ times a day. Shoulder extension  Sit in a stable chair without armrests, or stand up. Secure an exercise band to a stable object in front of you so it is at shoulder height. Hold one end of the exercise band in each hand. Straighten your elbows and lift your hands up to shoulder height. Squeeze your shoulder blades together as you pull your hands down to the sides of your thighs (extension). Stop when your hands are straight down by your sides. Do not let your hands go behind your body. Hold for __________ seconds. Slowly return to the starting position. Repeat __________ times. Complete this exercise __________ times a day. Shoulder row  Sit in a stable chair without armrests, or stand up. Secure an exercise band to a stable object in front of you so it is at chest height. Hold one end of the exercise band in each hand. Position your palms so that your thumbs are facing the ceiling (neutral position). Bend each of your elbows to a 90-degree angle (right angle) and keep your upper arms at your sides. Step back or move the chair back until the band is tight and there is no slack. Slowly pull your elbows back behind you. Hold for  __________ seconds. Slowly return to the starting position. Repeat __________ times. Complete this exercise __________ times a day. Shoulder press-ups  Sit in a stable chair that has armrests. Sit upright, with your feet flat on the floor.  Put your hands on the armrests so your elbows are bent and your fingers are pointing forward. Your hands should be about even with the sides of your body. Push down on the armrests and use your arms to lift yourself off the chair. Straighten your elbows and lift yourself up as much as you comfortably can. Move your shoulder blades down, and avoid letting your shoulders move up toward your ears. Keep your feet on the ground. As you get stronger, your feet should support less of your body weight as you lift yourself up. Hold for __________ seconds. Slowly lower yourself back into the chair. Repeat __________ times. Complete this exercise __________ times a day. Wall push-ups  Stand so you are facing a stable wall. Your feet should be about one arm-length away from the wall. Lean forward and place your palms on the wall at shoulder height. Keep your feet flat on the floor as you bend your elbows and lean forward toward the wall. Hold for __________ seconds. Straighten your elbows to push yourself back to the starting position. Repeat __________ times. Complete this exercise __________ times a day. This information is not intended to replace advice given to you by your health care provider. Make sure you discuss any questions you have with your health care provider. Document Revised: 09/02/2021 Document Reviewed: 09/02/2021 [employer] Patient Education  2024 [employer] Low Back Sprain or Strain Rehab Ask your health care provider which exercises are safe for you. Do exercises exactly as told by your health care provider and adjust them as directed. It is normal to feel mild stretching, pulling, tightness, or discomfort as you do these exercises. Stop right  away if you feel sudden pain or your pain gets worse. Do not begin these exercises until told by your health care provider. Stretching and range-of-motion exercises These exercises warm up your muscles and joints and improve the movement and flexibility of your back. These exercises also help to relieve pain, numbness, and tingling. Lumbar rotation  Lie on your back on a firm bed or the floor with your knees bent. Straighten your arms out to your sides so each arm forms a 90-degree angle (right angle) with a side of your body. Slowly move (rotate) both of your knees to one side of your body until you feel a stretch in your lower back (lumbar). Try not to let your shoulders lift off the floor. Hold this position for __________ seconds. Tense your abdominal muscles and slowly move your knees back to the starting position. Repeat this exercise on the other side of your body. Repeat __________ times. Complete this exercise __________ times a day. Single knee to chest  Lie on your back on a firm bed or the floor with both legs straight. Bend one of your knees. Use your hands to move your knee up toward your chest until you feel a gentle stretch in your lower back and buttock. Hold your leg in this position by holding on to the front of your knee. Keep your other leg as straight as possible. Hold this position for __________ seconds. Slowly return to the starting position. Repeat with your other leg. Repeat __________ times. Complete this exercise __________ times a day. Prone extension on elbows  Lie on your abdomen on a firm bed or the floor (prone position). Prop yourself up on your elbows. Use your arms to help lift your chest up until you feel a gentle stretch in your abdomen and your lower back. This will  place some of your body weight on your elbows. If this is uncomfortable, try stacking pillows under your chest. Your hips should stay down, against the surface that you are lying on.  Keep your hip and back muscles relaxed. Hold this position for __________ seconds. Slowly relax your upper body and return to the starting position. Repeat __________ times. Complete this exercise __________ times a day. Strengthening exercises These exercises build strength and endurance in your back. Endurance is the ability to use your muscles for a long time, even after they get tired. Pelvic tilt This exercise strengthens the muscles that lie deep in the abdomen. Lie on your back on a firm bed or the floor with your legs extended. Bend your knees so they are pointing toward the ceiling and your feet are flat on the floor. Tighten your lower abdominal muscles to press your lower back against the floor. This motion will tilt your pelvis so your tailbone points up toward the ceiling instead of pointing to your feet or the floor. To help with this exercise, you may place a small towel under your lower back and try to push your back into the towel. Hold this position for __________ seconds. Let your muscles relax completely before you repeat this exercise. Repeat __________ times. Complete this exercise __________ times a day. Alternating arm and leg raises  Get on your hands and knees on a firm surface. If you are on a hard floor, you may want to use padding, such as an exercise mat, to cushion your knees. Line up your arms and legs. Your hands should be directly below your shoulders, and your knees should be directly below your hips. Lift your left leg behind you. At the same time, raise your right arm and straighten it in front of you. Do not lift your leg higher than your hip. Do not lift your arm higher than your shoulder. Keep your abdominal and back muscles tight. Keep your hips facing the ground. Do not arch your back. Keep your balance carefully, and do not hold your breath. Hold this position for __________ seconds. Slowly return to the starting position. Repeat with your right  leg and your left arm. Repeat __________ times. Complete this exercise __________ times a day. Abdominal set with straight leg raise  Lie on your back on a firm bed or the floor. Bend one of your knees and keep your other leg straight. Tense your abdominal muscles and lift your straight leg up, 4-6 inches (10-15 cm) off the ground. Keep your abdominal muscles tight and hold this position for __________ seconds. Do not hold your breath. Do not arch your back. Keep it flat against the ground. Keep your abdominal muscles tense as you slowly lower your leg back to the starting position. Repeat with your other leg. Repeat __________ times. Complete this exercise __________ times a day. Single leg lower with bent knees Lie on your back on a firm bed or the floor. Tense your abdominal muscles and lift your feet off the floor, one foot at a time, so your knees and hips are bent in 90-degree angles (right angles). Your knees should be over your hips and your lower legs should be parallel to the floor. Keeping your abdominal muscles tense and your knee bent, slowly lower one of your legs so your toe touches the ground. Lift your leg back up to return to the starting position. Do not hold your breath. Do not let your back arch. Keep your back  flat against the ground. Repeat with your other leg. Repeat __________ times. Complete this exercise __________ times a day. Posture and body mechanics Good posture and healthy body mechanics can help to relieve stress in your body's tissues and joints. Body mechanics refers to the movements and positions of your body while you do your daily activities. Posture is part of body mechanics. Good posture means: Your spine is in its natural S-curve position (neutral). Your shoulders are pulled back slightly. Your head is not tipped forward (neutral). Follow these guidelines to improve your posture and body mechanics in your everyday activities. Standing  When  standing, keep your spine neutral and your feet about hip-width apart. Keep a slight bend in your knees. Your ears, shoulders, and hips should line up. When you do a task in which you stand in one place for a long time, place one foot up on a stable object that is 2-4 inches (5-10 cm) high, such as a footstool. This helps keep your spine neutral. Sitting  When sitting, keep your spine neutral and keep your feet flat on the floor. Use a footrest, if necessary, and keep your thighs parallel to the floor. Avoid rounding your shoulders, and avoid tilting your head forward. When working at a desk or a computer, keep your desk at a height where your hands are slightly lower than your elbows. Slide your chair under your desk so you are close enough to maintain good posture. When working at a computer, place your monitor at a height where you are looking straight ahead and you do not have to tilt your head forward or downward to look at the screen. Resting When lying down and resting, avoid positions that are most painful for you. If you have pain with activities such as sitting, bending, stooping, or squatting, lie in a position in which your body does not bend very much. For example, avoid curling up on your side with your arms and knees near your chest (fetal position). If you have pain with activities such as standing for a long time or reaching with your arms, lie with your spine in a neutral position and bend your knees slightly. Try the following positions: Lying on your side with a pillow between your knees. Lying on your back with a pillow under your knees. Lifting  When lifting objects, keep your feet at least shoulder-width apart and tighten your abdominal muscles. Bend your knees and hips and keep your spine neutral. It is important to lift using the strength of your legs, not your back. Do not lock your knees straight out. Always ask for help to lift heavy or awkward objects. This information  is not intended to replace advice given to you by your health care provider. Make sure you discuss any questions you have with your health care provider. Document Revised: 11/16/2022 Document Reviewed: 09/30/2020 [employer] Patient Education  2024 [employer]  Osteoarthritis  Osteoarthritis is a type of arthritis. It refers to joint pain or joint disease. Osteoarthritis affects tissue that covers the ends of bones in joints (cartilage). Cartilage acts as a cushion between the bones and helps them move smoothly. Osteoarthritis occurs when cartilage in the joints gets worn down. Osteoarthritis is sometimes called "wear and tear" arthritis. Osteoarthritis is the most common form of arthritis. It often occurs in older people. It is a condition that gets worse over time. The joints most often affected by this condition are in the fingers, toes, hips, knees, and spine, including the  neck and lower back. What are the causes? This condition is caused by the wearing down of cartilage that covers the ends of bones. What increases the risk? The following factors may make you more likely to develop this condition: Being age 52 or older. Obesity. Overuse of joints. Past injury of a joint. Past surgery on a joint. Family history of osteoarthritis. What are the signs or symptoms? The main symptoms of this condition are pain, swelling, and stiffness in the joint. Other symptoms may include: An enlarged joint. More pain and further damage caused by small pieces of bone or cartilage that break off and float inside of the joint. Small deposits of bone (osteophytes) that grow on the edges of the joint. A grating or scraping feeling inside the joint when you move it. Popping or creaking sounds when you move. Difficulty walking or exercising. An inability to grip items, twist your hand, or control the movements of your hands and fingers. How is this diagnosed? This condition may be diagnosed based on: Your  medical history. A physical exam. Your symptoms. X-rays of the affected joints. Blood tests to rule out other types of arthritis. How is this treated? There is no cure for this condition, but treatment can help control pain and improve joint function. Treatment may include a combination of therapies, such as: Pain relief techniques, such as: Applying heat and cold to the joint. Massage. A form of talk therapy called cognitive behavioral therapy (CBT). This therapy helps you set goals and follow up on the changes that you make. Medicines for pain and inflammation. The medicines can be taken by mouth or applied to the skin. They include: NSAIDs, such as ibuprofen . Prescription medicines. Strong anti-inflammatory medicines (corticosteroids). Certain nutritional supplements. A prescribed exercise program. You may work with a physical therapist. Assistive devices, such as a brace, wrap, splint, specialized glove, or cane. A weight control plan. Surgery, such as: An osteotomy. This is done to reposition the bones and relieve pain or to remove loose pieces of bone and cartilage. Joint replacement surgery. You may need this surgery if you have advanced osteoarthritis. Follow these instructions at home: Activity Rest your affected joints as told by your health care provider. Exercise as told by your provider. The provider may recommend specific types of exercise, such as: Strengthening exercises. These are done to strengthen the muscles that support joints affected by arthritis. Aerobic activities. These are exercises, such as brisk walking or water aerobics, that increase your heart rate. Range-of-motion activities. These help your joints move more easily. Balance and agility exercises. Managing pain, stiffness, and swelling     If told, apply heat to the affected area as often as told by your provider. Use the heat source that your provider recommends, such as a moist heat pack or a  heating pad. If you have a removable assistive device, remove it as told by your provider. Place a towel between your skin and the heat source. If your provider tells you to keep the assistive device on while you apply heat, place a towel between the assistive device and the heat source. Leave the heat on for 20-30 minutes. If told, put ice on the affected area. If you have a removable assistive device, remove it as told by your provider. Put ice in a plastic bag. Place a towel between your skin and the bag. If your provider tells you to keep the assistive device on during icing, place a towel between the assistive device and the  bag. Leave the ice on for 20 minutes, 2-3 times a day. If your skin turns bright red, remove the ice or heat right away to prevent skin damage. The risk of damage is higher if you cannot feel pain, heat, or cold. Move your fingers or toes often to reduce stiffness and swelling. Raise (elevate) the affected area above the level of your heart while you are sitting or lying down. General instructions Take over-the-counter and prescription medicines only as told by your provider. Maintain a healthy weight. Follow instructions from your provider for weight control. Do not use any products that contain nicotine or tobacco. These products include cigarettes, chewing tobacco, and vaping devices, such as e-cigarettes. If you need help quitting, ask your provider. Use assistive devices as told by your provider. Where to find more information [employer] of Arthritis and Musculoskeletal and Skin Diseases: niams.http://www.myers.net/ [employer] on Aging: BaseRingTones.pl American College of Rheumatology: rheumatology.org Contact a health care provider if: You have redness, swelling, or a feeling of warmth in a joint that gets worse. You have a fever along with joint or muscle aches. You develop a rash. You have trouble doing your normal activities. You have pain that gets worse  and is not relieved by pain medicine. This information is not intended to replace advice given to you by your health care provider. Make sure you discuss any questions you have with your health care provider. Document Revised: 03/12/2022 Document Reviewed: 03/12/2022 Elsevier Patient Education  2024 [employer].

## 2023-08-11 NOTE — Telephone Encounter (Signed)
 Referral placed to Physical therapy per Dr. Alvira Josephs

## 2023-08-12 ENCOUNTER — Ambulatory Visit: Payer: Medicaid Other | Attending: Rheumatology

## 2023-08-12 DIAGNOSIS — M5459 Other low back pain: Secondary | ICD-10-CM

## 2023-08-12 DIAGNOSIS — M2170 Unequal limb length (acquired), unspecified site: Secondary | ICD-10-CM | POA: Insufficient documentation

## 2023-08-12 DIAGNOSIS — R2689 Other abnormalities of gait and mobility: Secondary | ICD-10-CM | POA: Diagnosis not present

## 2023-08-12 NOTE — Therapy (Signed)
OUTPATIENT PHYSICAL THERAPY THORACOLUMBAR EVALUATION   Patient Name: Carol Ward MRN: [id number] DOB:Jan 23, 1961, 63 y.o., female Today's Date: 08/12/2023  END OF SESSION:  PT End of Session - 08/12/23 1339     Visit Number 1    Number of Visits 4    Date for PT Re-Evaluation 08/26/23    Authorization Type [place] [facility]    PT Start Time 1345    PT Stop Time 1430    PT Time Calculation (min) 45 min             Past Medical History:  Diagnosis Date   Breast cancer, right breast (HCC) June 2010   Poorly differentiated intraductal carcinoma. s/p chemo, radiation and lumpectomy. Triple recepetro negative. BRAC1 adn BRCA2 negative.   Diabetes mellitus, type II (HCC) 57/12   A1c 7.5    Hyperlipidemia 6/31/2011   Taking Simvistatin. LDL 217--127-107   Hypertension 2005   Treated with ACE ([facility] in [place])   Lump or mass in breast    Osteoarthritis of shoulder region 01/29/2011   Personal history of chemotherapy    Personal history of radiation therapy    Past Surgical History:  Procedure Laterality Date   BREAST LUMPECTOMY  Jan 2011   R breast    CHOLECYSTECTOMY N/A 07/07/2022   Procedure: LAPAROSCOPIC CHOLECYSTECTOMY;  Surgeon: Violeta Gelinas, MD;  Location: [facility] OR;  Service: General;  Laterality: N/A;   Patient Active Problem List   Diagnosis Date Noted   Allergies 04/28/2018   Dyslipidemia 03/11/2018   Post-menopausal bleeding 04/14/2017   Allergic rhinitis 02/26/2016   Periodic health assessment, general screening, adult 01/12/2014   Lymphedema Right arm and right breast 07/24/2013   Chronic closed-angle glaucoma 07/12/2012   HTN (hypertension) 12/11/2010   Hyperlipidemia 12/11/2010   History of breast cancer 12/11/2010    PCP: Gwenlyn Found, MD  REFERRING PROVIDER: Pollyann Savoy, MD  REFERRING DIAG: M21.70 (ICD-10-CM) - Leg length discrepancy R26.89 (ICD-10-CM) - Poor balance  Rationale for Evaluation and Treatment: Rehabilitation  THERAPY DIAG:   Other low back pain  ONSET DATE: > 6 months  SUBJECTIVE:                                                                                                                                                                                           SUBJECTIVE STATEMENT: Experiencing back, leg, and shoulder pains for some time (greater than 6 months).  Back pain has been present for some time and has now been experiencing radiating pain in left LE.  Notes with prolonged sitting (such as on commode) this can cause increased LLE discomfort as well. Pt  reports she slipped a few months ago which injured RLE but this has improved. Reports in September received injection to lumbar spine with modest results per patient. Greatest back and LLE pain with prolonged standing and walking  PERTINENT HISTORY:  According the patient her symptoms started many years ago. She states about 20 years ago she was told that she had spinal stenosis while she was living in [place]. She states she has had lower back pain since then which has been worse in the last 3 years  PAIN:  Are you having pain? Yes: NPRS scale: 5/10 Pain location: left back, buttocks, left lateral leg Pain description: burning some times sharp pain in LLE Aggravating factors: prolonged standing/walking Relieving factors: seated rest  PRECAUTIONS: None  RED FLAGS: None   WEIGHT BEARING RESTRICTIONS: No  FALLS:  Has patient fallen in last 6 months? Yes. Number of falls slip and fall at home, injuring RLE which has improved  LIVING ENVIRONMENT: Lives with: lives with their spouse Lives in: House/apartment Stairs: yes Has following equipment at home: None  OCCUPATION:   PLOF: Independent  PATIENT GOALS: reduce back pain  NEXT MD VISIT: as needed  OBJECTIVE:  Note: Objective measures were completed at Evaluation unless otherwise noted.  DIAGNOSTIC FINDINGS:  L3-L4 and L4-L5 disc space narrowing was noted.  L4-L5 spondylolisthesis   was noted.  Facet joint arthropathy was noted.   Impression: These findings suggestive of lumbar spondylosis,  spondylolisthesis and facet joint arthropathy.   SENSATION: Not tested  MUSCLE LENGTH: WFL  POSTURE: weight shift right  Leg Length: LLE 35" ASIS-medial malleolus 34.5" RLE ASIS-medial mal  PALPATION: Some tenderness to lumbar spine at joint lines and tenderness bilat greater trochanter  LUMBAR ROM:   AROM eval  Flexion WNL  Extension WNL  Right lateral flexion WNL  Left lateral flexion WNL  Right rotation WNL  Left rotation WNL   (Blank rows = not tested)  Increased discomfort to extension and left lateral flexion  LOWER EXTREMITY ROM:     Active  Right eval Left eval  Hip flexion WNL WNL  Hip extension    Hip abduction WNL WNL  Hip adduction    Hip internal rotation    Hip external rotation    Knee flexion WNL WNL  Knee extension WNL WNL  Ankle dorsiflexion WNL WNL  Ankle plantarflexion    Ankle inversion    Ankle eversion     (Blank rows = not tested)  LOWER EXTREMITY MMT:    MMT Right eval Left eval  Hip flexion 5 5  Hip extension    Hip abduction 4 4  Hip adduction    Hip internal rotation    Hip external rotation    Knee flexion 5 5  Knee extension 5 5  Ankle dorsiflexion 5 5  Ankle plantarflexion    Ankle inversion    Ankle eversion     (Blank rows = not tested)  Trunk flexion: 2+/5  LUMBAR SPECIAL TESTS:  Single leg stance 10 sec LLE 5-10 RLE SLR + LLE -RLE  FUNCTIONAL TESTS:    GAIT: Distance walked:  Assistive device utilized: None Level of assistance: Complete Independence Comments:   TREATMENT DATE: 08/12/23  PATIENT EDUCATION:  Education details: assessment details, HEP initiation, demo of adjustable heel lift Person educated: Patient and Spouse Education method: Explanation and  Handouts Education comprehension: verbalized understanding  HOME EXERCISE PROGRAM: Access Code: [email or web address] URL: [email or web address] Date: 08/12/2023 Prepared by: Shary Decamp  Exercises - Supine Posterior Pelvic Tilt  - 1 x daily - 7 x weekly - 3 sets - 10 reps - Hooklying Isometric Hip Flexion  - 1 x daily - 7 x weekly - 1-3 sets - 10 reps - 2 sec hold  ASSESSMENT:  CLINICAL IMPRESSION: Patient is a 63 y.o. lady who was seen today for physical therapy evaluation and treatment for LBP and suspected leg length discrepancy.  Lumbar spine ROM WNL with some discomfort to end-range extension and left lateral flexion.  Exhibits +SLR on LLE provocative for radicular symptoms into LE vs RLE.  Difficulty sustaining balance in single limb position and bilateral hip abduction weakness and trunk flexion weakness as well.  Endorses pain and radicular symptoms in LLE limiting walking distance and tolerance with some resolution to symptoms with seated rest periods.  Pt would benefit from PT services to provide relevant intervention and self-management strategies to reduce back and LLE pain to improve activity tolerance.  POC is of a short length as pt reports she will be leaving country for extended period of time  OBJECTIVE IMPAIRMENTS: decreased activity tolerance, decreased balance, difficulty walking, decreased strength, improper body mechanics, and pain.   ACTIVITY LIMITATIONS: carrying, lifting, bending, standing, and locomotion level  PARTICIPATION LIMITATIONS: meal prep, cleaning, shopping, and community activity  PERSONAL FACTORS: Age and Time since onset of injury/illness/exacerbation are also affecting patient's functional outcome.   REHAB POTENTIAL: Fair due to time since onset and brief POC due to patient scheduling  CLINICAL DECISION MAKING: Stable/uncomplicated  EVALUATION COMPLEXITY: Low   GOALS: Goals reviewed with patient? Yes  SHORT TERM GOALS: Target date: same  as LTG    LONG TERM GOALS: Target date: 08/26/2023    Demo independence with HEP for core/trunk/hip abd strengthening to improve lumbar stability Baseline:  Goal status: INITIAL  2.  Teach-back techniques and principles to reduce back pain with prolonged sitting/standing Baseline:  Goal status: INITIAL  3.  Demo proper body mechanics for picking up items/objects from ground to reduce risk for back strain Baseline:  Goal status: INITIAL    PLAN:  PT FREQUENCY: 2x/week  PT DURATION: 2 weeks  PLANNED INTERVENTIONS: 97110-Therapeutic exercises, 97530- Therapeutic activity, 97112- Neuromuscular re-education, 97535- Self Care, 16109- Manual therapy, [phone]- Gait training, [phone]- Canalith repositioning, [phone]- Aquatic Therapy, Patient/Family education, Balance training, Stair training, Joint mobilization, Spinal mobilization, and DME instructions.  PLAN FOR NEXT SESSION: HEP review, progress with hip strength for bed exercise, single leg stance, did she start using heel lift?   2:45 PM, 08/12/23 M. Shary Decamp, PT, DPT Physical Therapist- [place] Office Number: [phone]

## 2023-08-13 NOTE — Therapy (Signed)
OUTPATIENT PHYSICAL THERAPY THORACOLUMBAR TREATMENT   Patient Name: Carol Ward MRN: [id number] DOB:[birth year], 63 y.o., female Today's Date: 08/16/2023  END OF SESSION:  PT End of Session - 08/16/23 1605     Visit Number 2    Number of Visits 4    Date for PT Re-Evaluation 08/26/23    Authorization Type [place] [facility] UHC    PT Start Time 1527    PT Stop Time 1609    PT Time Calculation (min) 42 min    Activity Tolerance Patient tolerated treatment well    Behavior During Therapy [facility] for tasks assessed/performed              Past Medical History:  Diagnosis Date   Breast cancer, right breast (HCC) June 2010   Poorly differentiated intraductal carcinoma. s/p chemo, radiation and lumpectomy. Triple recepetro negative. BRAC1 adn BRCA2 negative.   Diabetes mellitus, type II (HCC) 57/12   A1c 7.5    Hyperlipidemia 6/31/2011   Taking Simvistatin. LDL 217--127-107   Hypertension 2005   Treated with ACE ([facility] in [place])   Lump or mass in breast    Osteoarthritis of shoulder region 01/29/2011   Personal history of chemotherapy    Personal history of radiation therapy    Past Surgical History:  Procedure Laterality Date   BREAST LUMPECTOMY  Jan 2011   R breast    CHOLECYSTECTOMY N/A 07/07/2022   Procedure: LAPAROSCOPIC CHOLECYSTECTOMY;  Surgeon: Violeta Gelinas, MD;  Location: [facility] OR;  Service: General;  Laterality: N/A;   Patient Active Problem List   Diagnosis Date Noted   Allergies 04/28/2018   Dyslipidemia 03/11/2018   Post-menopausal bleeding 04/14/2017   Allergic rhinitis 02/26/2016   Periodic health assessment, general screening, adult 01/12/2014   Lymphedema Right arm and right breast 07/24/2013   Chronic closed-angle glaucoma 07/12/2012   HTN (hypertension) 12/11/2010   Hyperlipidemia 12/11/2010   History of breast cancer 12/11/2010    PCP: Gwenlyn Found, MD  REFERRING PROVIDER: Pollyann Savoy, MD  REFERRING DIAG: M21.70 (ICD-10-CM) - Leg length  discrepancy R26.89 (ICD-10-CM) - Poor balance  Rationale for Evaluation and Treatment: Rehabilitation  THERAPY DIAG:  Other low back pain  ONSET DATE: > 6 months  SUBJECTIVE:                                                                                                                                                                                           SUBJECTIVE STATEMENT: Husband present at appointment. Feeling the same, no changes. Has been working on [employer] and denies issues. Reports that they have not yet gotten a heel lift.   PERTINENT  HISTORY:  According the patient her symptoms started many years ago. She states about 20 years ago she was told that she had spinal stenosis while she was living in [place]. She states she has had lower back pain since then which has been worse in the last 3 years  PAIN:  Are you having pain? Yes: NPRS scale: 5-6/10 Pain location: left back, buttocks, left lateral leg Pain description: burning some times sharp pain in LLE Aggravating factors: prolonged standing/walking Relieving factors: seated rest  PRECAUTIONS: None  RED FLAGS: None   WEIGHT BEARING RESTRICTIONS: No  FALLS:  Has patient fallen in last 6 months? Yes. Number of falls slip and fall at home, injuring RLE which has improved  LIVING ENVIRONMENT: Lives with: lives with their spouse Lives in: House/apartment Stairs: yes Has following equipment at home: None  OCCUPATION:   PLOF: Independent  PATIENT GOALS: reduce back pain  NEXT MD VISIT: as needed  OBJECTIVE:     TODAY'S TREATMENT: 08/16/23 Activity Comments  HEP review: supine posteiror pelvic tilts 10x hooklying isometric hip flexion 10x Required heavy manual cues to coordinate posterior pelvic tilts   Sitting pelvic tilts 10x Improved understanding of movement pattern   sitting prayer stretch with pball forward 10x, then R/L diagonal 5x each Reports c/o L lower leg and LB pain going to the L  (discontinued)  Bridge 10x Cues to contract core before lifting hips   Straight leg bridge on pball 10x  Tolerated well; good form  sidelying hip ABD 2x10 each side  Required set up for proper alignment. Reports "feels good"  Hooklying fig 4 and KTOS stretches 30" each Cueing to maintain position of stretch   Nustep L5 x 6 min UEs/LEs  Cueing to maintain at least 60SPM             HOME EXERCISE PROGRAM Last updated: 08/16/23 Access Code: [email or web address] URL: [email or web address] Date: 08/16/2023 Prepared by: [facility] - [facility]  Exercises - Hooklying Isometric Hip Flexion  - 1 x daily - 7 x weekly - 1-3 sets - 10 reps - 2 sec hold - Seated Pelvic Tilt  - 1 x daily - 5 x weekly - 2 sets - 10 reps - Sidelying Hip Abduction  - 1 x daily - 5 x weekly - 2 sets - 10 reps - Supine Piriformis Stretch with Foot on Ground  - 1 x daily - 5 x weekly - 2 sets - 30 sec hold - Supine Figure 4 Piriformis Stretch  - 1 x daily - 5 x weekly - 2 sets - 30 sec hold   PATIENT EDUCATION: Education details: discussed what other exercises patient is doing on her own for her back, HEP update, 30 day hold next session d/t pt going out of country Person educated: Patient, spouse Education method: Explanation, Demonstration, Tactile cues, Verbal cues, and Handouts Education comprehension: verbalized understanding and returned demonstration    Note: Objective measures were completed at Evaluation unless otherwise noted.  DIAGNOSTIC FINDINGS:  L3-L4 and L4-L5 disc space narrowing was noted.  L4-L5 spondylolisthesis  was noted.  Facet joint arthropathy was noted.   Impression: These findings suggestive of lumbar spondylosis,  spondylolisthesis and facet joint arthropathy.   SENSATION: Not tested  MUSCLE LENGTH: WFL  POSTURE: weight shift right  Leg Length: LLE 35" ASIS-medial malleolus 34.5" RLE ASIS-medial mal  PALPATION: Some tenderness to lumbar spine at  joint lines and tenderness bilat greater trochanter  LUMBAR ROM:  AROM eval  Flexion WNL  Extension WNL  Right lateral flexion WNL  Left lateral flexion WNL  Right rotation WNL  Left rotation WNL   (Blank rows = not tested)  Increased discomfort to extension and left lateral flexion  LOWER EXTREMITY ROM:     Active  Right eval Left eval  Hip flexion WNL WNL  Hip extension    Hip abduction WNL WNL  Hip adduction    Hip internal rotation    Hip external rotation    Knee flexion WNL WNL  Knee extension WNL WNL  Ankle dorsiflexion WNL WNL  Ankle plantarflexion    Ankle inversion    Ankle eversion     (Blank rows = not tested)  LOWER EXTREMITY MMT:    MMT Right eval Left eval  Hip flexion 5 5  Hip extension    Hip abduction 4 4  Hip adduction    Hip internal rotation    Hip external rotation    Knee flexion 5 5  Knee extension 5 5  Ankle dorsiflexion 5 5  Ankle plantarflexion    Ankle inversion    Ankle eversion     (Blank rows = not tested)  Trunk flexion: 2+/5  LUMBAR SPECIAL TESTS:  Single leg stance 10 sec LLE 5-10 RLE SLR + LLE -RLE  FUNCTIONAL TESTS:    GAIT: Distance walked:  Assistive device utilized: None Level of assistance: Complete Independence Comments:   TREATMENT DATE: 08/12/23                                                                                                                                 PATIENT EDUCATION:  Education details: assessment details, HEP initiation, demo of adjustable heel lift Person educated: Patient and Spouse Education method: Explanation and Handouts Education comprehension: verbalized understanding  HOME EXERCISE PROGRAM: Access Code: [email or web address] URL: [email or web address] Date: 08/12/2023 Prepared by: Shary Decamp  Exercises - Supine Posterior Pelvic Tilt  - 1 x daily - 7 x weekly - 3 sets - 10 reps - Hooklying Isometric Hip Flexion  - 1 x daily - 7 x weekly - 1-3 sets - 10  reps - 2 sec hold  ASSESSMENT:  CLINICAL IMPRESSION: Patient arrived to session with husband without new complaints. Reviewed HEP for max understanding and carryover- HEP was adjusted to improve pt's success. Progressed exercises with focus on hip and core strengthening, which required cues for proper alignment and muscle activation but was tolerated very well. Provided updated HEP with exercises that were well-tolerated today. Patient reported understanding and without complaints at end of session.    OBJECTIVE IMPAIRMENTS: decreased activity tolerance, decreased balance, difficulty walking, decreased strength, improper body mechanics, and pain.   ACTIVITY LIMITATIONS: carrying, lifting, bending, standing, and locomotion level  PARTICIPATION LIMITATIONS: meal prep, cleaning, shopping, and community activity  PERSONAL FACTORS: Age and Time since onset of injury/illness/exacerbation are also affecting patient's functional outcome.   REHAB POTENTIAL:  Fair due to time since onset and brief POC due to patient scheduling  CLINICAL DECISION MAKING: Stable/uncomplicated  EVALUATION COMPLEXITY: Low   GOALS: Goals reviewed with patient? Yes  SHORT TERM GOALS: Target date: same as LTG    LONG TERM GOALS: Target date: 08/26/2023    Demo independence with HEP for core/trunk/hip abd strengthening to improve lumbar stability Baseline:  Goal status: IN PROGRESS  2.  Teach-back techniques and principles to reduce back pain with prolonged sitting/standing Baseline:  Goal status: IN PROGRESS  3.  Demo proper body mechanics for picking up items/objects from ground to reduce risk for back strain Baseline:  Goal status: IN PROGRESS     PLAN:  PT FREQUENCY: 2x/week  PT DURATION: 2 weeks  PLANNED INTERVENTIONS: 97110-Therapeutic exercises, 97530- Therapeutic activity, 97112- Neuromuscular re-education, 97535- Self Care, 78469- Manual therapy, [phone]- Gait training, [phone]- Canalith  repositioning, [phone]- Aquatic Therapy, Patient/Family education, Balance training, Stair training, Joint mobilization, Spinal mobilization, and DME instructions.  PLAN FOR NEXT SESSION: 30 day hold; HEP review, progress with hip strength for bed exercise, single leg stance, did she start using heel lift?   Baldemar Friday, PT, DPT 08/16/23 4:10 PM  [facility] [address] [place], [address] [place], [place] [address] Phone # [phone] Fax # [phone]

## 2023-08-16 ENCOUNTER — Encounter: Payer: Self-pay | Admitting: Physical Therapy

## 2023-08-16 ENCOUNTER — Ambulatory Visit: Payer: Medicaid Other | Admitting: Physical Therapy

## 2023-08-16 DIAGNOSIS — M5459 Other low back pain: Secondary | ICD-10-CM

## 2023-08-16 NOTE — Therapy (Signed)
OUTPATIENT PHYSICAL THERAPY THORACOLUMBAR TREATMENT   Patient Name: Carol Ward MRN: [id number] DOB:[birth year], 63 y.o., female Today's Date: 08/18/2023  END OF SESSION:  PT End of Session - 08/18/23 1601     Visit Number 3    Number of Visits 4    Date for PT Re-Evaluation 08/26/23    Authorization Type [place] [employer]    PT Start Time 1533    PT Stop Time 1601    PT Time Calculation (min) 28 min    Activity Tolerance Patient tolerated treatment well    Behavior During Therapy [facility] for tasks assessed/performed               Past Medical History:  Diagnosis Date   Breast cancer, right breast (HCC) June 2010   Poorly differentiated intraductal carcinoma. s/p chemo, radiation and lumpectomy. Triple recepetro negative. BRAC1 adn BRCA2 negative.   Diabetes mellitus, type II (HCC) 57/12   A1c 7.5    Hyperlipidemia 6/31/2011   Taking Simvistatin. LDL 217--127-107   Hypertension 2005   Treated with ACE ([facility] in [place])   Lump or mass in breast    Osteoarthritis of shoulder region 01/29/2011   Personal history of chemotherapy    Personal history of radiation therapy    Past Surgical History:  Procedure Laterality Date   BREAST LUMPECTOMY  Jan 2011   R breast    CHOLECYSTECTOMY N/A 07/07/2022   Procedure: LAPAROSCOPIC CHOLECYSTECTOMY;  Surgeon: Violeta Gelinas, MD;  Location: [facility] OR;  Service: General;  Laterality: N/A;   Patient Active Problem List   Diagnosis Date Noted   Allergies 04/28/2018   Dyslipidemia 03/11/2018   Post-menopausal bleeding 04/14/2017   Allergic rhinitis 02/26/2016   Periodic health assessment, general screening, adult 01/12/2014   Lymphedema Right arm and right breast 07/24/2013   Chronic closed-angle glaucoma 07/12/2012   HTN (hypertension) 12/11/2010   Hyperlipidemia 12/11/2010   History of breast cancer 12/11/2010    PCP: Gwenlyn Found, MD  REFERRING PROVIDER: Pollyann Savoy, MD  REFERRING DIAG: M21.70 (ICD-10-CM) - Leg length  discrepancy R26.89 (ICD-10-CM) - Poor balance  Rationale for Evaluation and Treatment: Rehabilitation  THERAPY DIAG:  Other low back pain  ONSET DATE: > 6 months  SUBJECTIVE:                                                                                                                                                                                           SUBJECTIVE STATEMENT: Doing good. "It's too cold." Confirms that she will be going on a trip.   PERTINENT HISTORY:  According the patient her symptoms started many years ago. She  states about 20 years ago she was told that she had spinal stenosis while she was living in [place]. She states she has had lower back pain since then which has been worse in the last 3 years  PAIN:  Are you having pain? Yes: NPRS scale: 3-4/10 Pain location: left back, buttocks, left lateral leg Pain description: burning some times sharp pain in LLE Aggravating factors: prolonged standing/walking Relieving factors: seated rest  PRECAUTIONS: None  RED FLAGS: None   WEIGHT BEARING RESTRICTIONS: No  FALLS:  Has patient fallen in last 6 months? Yes. Number of falls slip and fall at home, injuring RLE which has improved  LIVING ENVIRONMENT: Lives with: lives with their spouse Lives in: House/apartment Stairs: yes Has following equipment at home: None  OCCUPATION:   PLOF: Independent  PATIENT GOALS: reduce back pain  NEXT MD VISIT: as needed  OBJECTIVE:     TODAY'S TREATMENT: 08/18/23 Activity Comments  - Sidelying Hip Abduction  10x each - Supine Piriformis Stretch with Foot on Ground  30" - Supine Figure 4 Piriformis Stretch  30" each Tolerated well; minor cues.   edu on body mechanics and how to address LBP after prolonged positions  Pt reports understanding                  PATIENT EDUCATION: Education details: 30 day hold and advised to return with new referral if >30 days, edu on where to purchase heel lift; handout on  posture and body mechanics  Person educated: Patient Education method: Explanation, Demonstration, Tactile cues, Verbal cues, and Handouts Education comprehension: verbalized understanding and returned demonstration   HOME EXERCISE PROGRAM Last updated: 08/16/23 Access Code: [email or web address] URL: [email or web address] Date: 08/16/2023 Prepared by: [facility] - Outpatient  Rehab - [facility]  Exercises - Hooklying Isometric Hip Flexion  - 1 x daily - 7 x weekly - 1-3 sets - 10 reps - 2 sec hold - Seated Pelvic Tilt  - 1 x daily - 5 x weekly - 2 sets - 10 reps - Sidelying Hip Abduction  - 1 x daily - 5 x weekly - 2 sets - 10 reps - Supine Piriformis Stretch with Foot on Ground  - 1 x daily - 5 x weekly - 2 sets - 30 sec hold - Supine Figure 4 Piriformis Stretch  - 1 x daily - 5 x weekly - 2 sets - 30 sec hold      Note: Objective measures were completed at Evaluation unless otherwise noted.  DIAGNOSTIC FINDINGS:  L3-L4 and L4-L5 disc space narrowing was noted.  L4-L5 spondylolisthesis  was noted.  Facet joint arthropathy was noted.   Impression: These findings suggestive of lumbar spondylosis,  spondylolisthesis and facet joint arthropathy.   SENSATION: Not tested  MUSCLE LENGTH: WFL  POSTURE: weight shift right  Leg Length: LLE 35" ASIS-medial malleolus 34.5" RLE ASIS-medial mal  PALPATION: Some tenderness to lumbar spine at joint lines and tenderness bilat greater trochanter  LUMBAR ROM:   AROM eval  Flexion WNL  Extension WNL  Right lateral flexion WNL  Left lateral flexion WNL  Right rotation WNL  Left rotation WNL   (Blank rows = not tested)  Increased discomfort to extension and left lateral flexion  LOWER EXTREMITY ROM:     Active  Right eval Left eval  Hip flexion WNL WNL  Hip extension    Hip abduction WNL WNL  Hip adduction    Hip internal rotation    Hip external rotation  Knee flexion WNL WNL  Knee extension WNL WNL  Ankle  dorsiflexion WNL WNL  Ankle plantarflexion    Ankle inversion    Ankle eversion     (Blank rows = not tested)  LOWER EXTREMITY MMT:    MMT Right eval Left eval  Hip flexion 5 5  Hip extension    Hip abduction 4 4  Hip adduction    Hip internal rotation    Hip external rotation    Knee flexion 5 5  Knee extension 5 5  Ankle dorsiflexion 5 5  Ankle plantarflexion    Ankle inversion    Ankle eversion     (Blank rows = not tested)  Trunk flexion: 2+/5  LUMBAR SPECIAL TESTS:  Single leg stance 10 sec LLE 5-10 RLE SLR + LLE -RLE  FUNCTIONAL TESTS:    GAIT: Distance walked:  Assistive device utilized: None Level of assistance: Complete Independence Comments:   TREATMENT DATE: 08/12/23                                                                                                                                 PATIENT EDUCATION:  Education details: assessment details, HEP initiation, demo of adjustable heel lift Person educated: Patient and Spouse Education method: Explanation and Handouts Education comprehension: verbalized understanding  HOME EXERCISE PROGRAM: Access Code: [email or web address] URL: [email or web address] Date: 08/12/2023 Prepared by: Shary Decamp  Exercises - Supine Posterior Pelvic Tilt  - 1 x daily - 7 x weekly - 3 sets - 10 reps - Hooklying Isometric Hip Flexion  - 1 x daily - 7 x weekly - 1-3 sets - 10 reps - 2 sec hold  ASSESSMENT:  CLINICAL IMPRESSION: Patient arrived to session without new complaints. Reviewed HEP updates from last session for max understanding and carryover as patient will be placed on 30 day hold today d/t going out of the country. Patient demonstrated good tolerance and carryover. Provided education on body mechanics, posture, and ways to reduce prolonged positioning. Patient reported understanding of all edu and without complaints at end of session.  OBJECTIVE IMPAIRMENTS: decreased activity tolerance, decreased  balance, difficulty walking, decreased strength, improper body mechanics, and pain.   ACTIVITY LIMITATIONS: carrying, lifting, bending, standing, and locomotion level  PARTICIPATION LIMITATIONS: meal prep, cleaning, shopping, and community activity  PERSONAL FACTORS: Age and Time since onset of injury/illness/exacerbation are also affecting patient's functional outcome.   REHAB POTENTIAL: Fair due to time since onset and brief POC due to patient scheduling  CLINICAL DECISION MAKING: Stable/uncomplicated  EVALUATION COMPLEXITY: Low   GOALS: Goals reviewed with patient? Yes  SHORT TERM GOALS: Target date: same as LTG    LONG TERM GOALS: Target date: 08/26/2023    Demo independence with HEP for core/trunk/hip abd strengthening to improve lumbar stability Baseline: met for current 08/18/23 Goal status: IN PROGRESS 08/18/23  2.  Teach-back techniques and principles to reduce back pain with prolonged sitting/standing Baseline: edu provided  this date 08/18/23 Goal status: IN PROGRESS 08/18/23  3.  Demo proper body mechanics for picking up items/objects from ground to reduce risk for back strain Baseline: edu provided this date 08/18/23 Goal status: IN PROGRESS  08/18/23    PLAN:  PT FREQUENCY: 2x/week  PT DURATION: 2 weeks  PLANNED INTERVENTIONS: 97110-Therapeutic exercises, 97530- Therapeutic activity, 97112- Neuromuscular re-education, 97535- Self Care, 29518- Manual therapy, [phone]- Gait training, [phone]- Canalith repositioning, [phone]- Aquatic Therapy, Patient/Family education, Balance training, Stair training, Joint mobilization, Spinal mobilization, and DME instructions.  PLAN FOR NEXT SESSION: 30 day hold at this time   Baldemar Friday, [place], DPT 08/18/23 4:02 PM  [facility] at [facility] [address] [place], [address] [place], [place] [address] Phone # [phone] Fax # [phone]

## 2023-08-18 ENCOUNTER — Encounter: Payer: Self-pay | Admitting: Physical Therapy

## 2023-08-18 ENCOUNTER — Ambulatory Visit: Payer: Medicaid Other | Admitting: Physical Therapy

## 2023-08-18 DIAGNOSIS — M5459 Other low back pain: Secondary | ICD-10-CM

## 2023-08-18 NOTE — Patient Instructions (Signed)

## 2023-09-09 NOTE — Therapy (Signed)
 OUTPATIENT PHYSICAL THERAPY THORACOLUMBAR PROGRESS NOTE/RE-CERT   Patient Name: Adalind Weitz MRN: [id number] DOB:1961/01/09, 63 y.o., female Today's Date: 09/13/2023  END OF SESSION:  PT End of Session - 09/13/23 1559     Visit Number 4    Number of Visits 8    Date for PT Re-Evaluation 10/11/23    Authorization Type [place] MCD [employer]    PT Start Time 1531    PT Stop Time 1611    PT Time Calculation (min) 40 min    Activity Tolerance Patient tolerated treatment well    Behavior During Therapy [facility] for tasks assessed/performed                Past Medical History:  Diagnosis Date   Breast cancer, right breast (HCC) June 2010   Poorly differentiated intraductal carcinoma. s/p chemo, radiation and lumpectomy. Triple recepetro negative. BRAC1 adn BRCA2 negative.   Diabetes mellitus, type II (HCC) 57/12   A1c 7.5    Hyperlipidemia 6/31/2011   Taking Simvistatin. LDL 217--127-107   Hypertension 2005   Treated with ACE ([facility] in [place])   Lump or mass in breast    Osteoarthritis of shoulder region 01/29/2011   Personal history of chemotherapy    Personal history of radiation therapy    Past Surgical History:  Procedure Laterality Date   BREAST LUMPECTOMY  Jan 2011   R breast    CHOLECYSTECTOMY N/A 07/07/2022   Procedure: LAPAROSCOPIC CHOLECYSTECTOMY;  Surgeon: Violeta Gelinas, MD;  Location: [facility] OR;  Service: General;  Laterality: N/A;   Patient Active Problem List   Diagnosis Date Noted   Allergies 04/28/2018   Dyslipidemia 03/11/2018   Post-menopausal bleeding 04/14/2017   Allergic rhinitis 02/26/2016   Periodic health assessment, general screening, adult 01/12/2014   Lymphedema Right arm and right breast 07/24/2013   Chronic closed-angle glaucoma 07/12/2012   HTN (hypertension) 12/11/2010   Hyperlipidemia 12/11/2010   History of breast cancer 12/11/2010    PCP: Gwenlyn Found, MD  REFERRING PROVIDER: Pollyann Savoy, MD  REFERRING DIAG: M21.70 (ICD-10-CM)  - Leg length discrepancy R26.89 (ICD-10-CM) - Poor balance  Rationale for Evaluation and Treatment: Rehabilitation  THERAPY DIAG:  Other low back pain  ONSET DATE: > 6 months  SUBJECTIVE:                                                                                                                                                                                           SUBJECTIVE STATEMENT: Went on a long trip. Reports that her back, knee, and ankle was hurting from the trip. Reports that she was not able to perform her HEP  recently d/t travel and food poisoning.   PERTINENT HISTORY:  According the patient her symptoms started many years ago. She states about 20 years ago she was told that she had spinal stenosis while she was living in [place]. She states she has had lower back pain since then which has been worse in the last 3 years  PAIN:  Are you having pain? Yes: NPRS scale: 7/10 Pain location: B LE Pain description: burning some times sharp pain in LLE Aggravating factors: prolonged standing/walking Relieving factors: seated rest  PRECAUTIONS: None  RED FLAGS: None   WEIGHT BEARING RESTRICTIONS: No  FALLS:  Has patient fallen in last 6 months? Yes. Number of falls slip and fall at home, injuring RLE which has improved  LIVING ENVIRONMENT: Lives with: lives with their spouse Lives in: House/apartment Stairs: yes Has following equipment at home: None  OCCUPATION:   PLOF: Independent  PATIENT GOALS: reduce back pain  NEXT MD VISIT: as needed  OBJECTIVE:    TODAY'S TREATMENT: 09/13/23 Activity Comments  LTR  Cueing for pacing   SKTC 30" Good tolerance  Supine fig 4, KTOS 2x30" each Good tolerance  Open book stretch 10x each Verbal and manual cueing for positioning   standing paloff press pulley 10# 2x10 each side R-ward lean and tendency to lean forward slightly with each rep  standing shoulder extension pulley 2x10 20# Cueing to reduce pace d/t limited  control; cues to contract core     PATIENT EDUCATION: Education details: discussion on progress towards goals and remaining impairments, POC, pt's schedule; encouraged return back to routine with HEP  Person educated: Patient Education method: Explanation, Demonstration, Tactile cues, Verbal cues, and Handouts Education comprehension: verbalized understanding and returned demonstration    HOME EXERCISE PROGRAM Last updated: 08/16/23 Access Code: [email or web address] URL: [email or web address] Date: 08/16/2023 Prepared by: [facility] - Outpatient  Rehab - [facility]  Exercises - Hooklying Isometric Hip Flexion  - 1 x daily - 7 x weekly - 1-3 sets - 10 reps - 2 sec hold - Seated Pelvic Tilt  - 1 x daily - 5 x weekly - 2 sets - 10 reps - Sidelying Hip Abduction  - 1 x daily - 5 x weekly - 2 sets - 10 reps - Supine Piriformis Stretch with Foot on Ground  - 1 x daily - 5 x weekly - 2 sets - 30 sec hold - Supine Figure 4 Piriformis Stretch  - 1 x daily - 5 x weekly - 2 sets - 30 sec hold      Note: Objective measures were completed at Evaluation unless otherwise noted.  DIAGNOSTIC FINDINGS:  L3-L4 and L4-L5 disc space narrowing was noted.  L4-L5 spondylolisthesis  was noted.  Facet joint arthropathy was noted.   Impression: These findings suggestive of lumbar spondylosis,  spondylolisthesis and facet joint arthropathy.   SENSATION: Not tested  MUSCLE LENGTH: WFL  POSTURE: weight shift right  Leg Length: LLE 35" ASIS-medial malleolus 34.5" RLE ASIS-medial mal  PALPATION: Some tenderness to lumbar spine at joint lines and tenderness bilat greater trochanter  LUMBAR ROM:   AROM eval  Flexion WNL  Extension WNL  Right lateral flexion WNL  Left lateral flexion WNL  Right rotation WNL  Left rotation WNL   (Blank rows = not tested)  Increased discomfort to extension and left lateral flexion  LOWER EXTREMITY ROM:     Active  Right eval Left eval  Hip  flexion WNL WNL  Hip extension  Hip abduction WNL WNL  Hip adduction    Hip internal rotation    Hip external rotation    Knee flexion WNL WNL  Knee extension WNL WNL  Ankle dorsiflexion WNL WNL  Ankle plantarflexion    Ankle inversion    Ankle eversion     (Blank rows = not tested)  LOWER EXTREMITY MMT:    MMT Right eval Left eval  Hip flexion 5 5  Hip extension    Hip abduction 4 4  Hip adduction    Hip internal rotation    Hip external rotation    Knee flexion 5 5  Knee extension 5 5  Ankle dorsiflexion 5 5  Ankle plantarflexion    Ankle inversion    Ankle eversion     (Blank rows = not tested)  Trunk flexion: 2+/5  LUMBAR SPECIAL TESTS:  Single leg stance 10 sec LLE 5-10 RLE SLR + LLE -RLE  FUNCTIONAL TESTS:    GAIT: Distance walked:  Assistive device utilized: None Level of assistance: Complete Independence Comments:   TREATMENT DATE: 08/12/23                                                                                                                                 PATIENT EDUCATION:  Education details: assessment details, HEP initiation, demo of adjustable heel lift Person educated: Patient and Spouse Education method: Explanation and Handouts Education comprehension: verbalized understanding  HOME EXERCISE PROGRAM: Access Code: [email or web address] URL: [email or web address] Date: 08/12/2023 Prepared by: Shary Decamp  Exercises - Supine Posterior Pelvic Tilt  - 1 x daily - 7 x weekly - 3 sets - 10 reps - Hooklying Isometric Hip Flexion  - 1 x daily - 7 x weekly - 1-3 sets - 10 reps - 2 sec hold  ASSESSMENT:  CLINICAL IMPRESSION: Patient arrived to session since hiatus from therapy d/t travel. Reports increase in LBP since her trip and reports that she was not able to perform her HEP recently d/t travel and food poisoning. As patient's LBP has been flared from recent trip, plan to extend POC 1x/week for 4 weeks to address remaining  goals and reduce pain levels. Patient performed gentle stretching and core strengthening with good tolerance today. Patient reported pain no worse at end of session.    OBJECTIVE IMPAIRMENTS: decreased activity tolerance, decreased balance, difficulty walking, decreased strength, improper body mechanics, and pain.   ACTIVITY LIMITATIONS: carrying, lifting, bending, standing, and locomotion level  PARTICIPATION LIMITATIONS: meal prep, cleaning, shopping, and community activity  PERSONAL FACTORS: Age and Time since onset of injury/illness/exacerbation are also affecting patient's functional outcome.   REHAB POTENTIAL: Fair due to time since onset and brief POC due to patient scheduling  CLINICAL DECISION MAKING: Stable/uncomplicated  EVALUATION COMPLEXITY: Low   GOALS: Goals reviewed with patient? Yes  SHORT TERM GOALS: Target date: same as LTG    LONG TERM GOALS: Target date: 10/11/2023    Demo independence  with HEP for core/trunk/hip abd strengthening to improve lumbar stability Baseline: met for current 08/18/23; reports she has not been able d/t travel and food poisoning 09/13/23  Goal status: IN PROGRESS 09/13/23   2.  Teach-back techniques and principles to reduce back pain with prolonged sitting/standing Baseline: edu provided this date 08/18/23; reports increased pain levels since travel 09/13/23 Goal status: IN PROGRESS 09/13/23  3.  Demo proper body mechanics for picking up items/objects from ground to reduce risk for back strain Baseline: edu provided this date 08/18/23 Goal status: IN PROGRESS  08/18/23    PLAN:  PT FREQUENCY: 1x/week  PT DURATION: 4 weeks  PLANNED INTERVENTIONS: 97110-Therapeutic exercises, 97530- Therapeutic activity, 97112- Neuromuscular re-education, 97535- Self Care, 82956- Manual therapy, [phone]- Gait training, [phone]- Canalith repositioning, [phone]- Aquatic Therapy, Patient/Family education, Balance training, Stair training, Joint mobilization,  Spinal mobilization, and DME instructions.  PLAN FOR NEXT SESSION: continue core strengthening    Baldemar Friday, PT, DPT 09/13/23 4:13 PM  [facility] at [facility] [address] [place], [address] [place], [place] [address] Phone # [phone] Fax # [phone]

## 2023-09-10 ENCOUNTER — Ambulatory Visit: Payer: Medicaid Other | Admitting: Rheumatology

## 2023-09-13 ENCOUNTER — Ambulatory Visit: Payer: Medicaid Other | Attending: Rheumatology | Admitting: Physical Therapy

## 2023-09-13 ENCOUNTER — Encounter: Payer: Self-pay | Admitting: Physical Therapy

## 2023-09-13 DIAGNOSIS — M5459 Other low back pain: Secondary | ICD-10-CM | POA: Diagnosis present

## 2023-09-23 ENCOUNTER — Ambulatory Visit: Payer: Medicaid Other

## 2023-09-23 DIAGNOSIS — M5459 Other low back pain: Secondary | ICD-10-CM

## 2023-09-23 NOTE — Therapy (Signed)
 OUTPATIENT PHYSICAL THERAPY THORACOLUMBAR TREATMENT   Patient Name: Carol Ward MRN: [id number] DOB:02-19-1961, 63 y.o., female Today's Date: 09/23/2023  END OF SESSION:  PT End of Session - 09/23/23 1147     Visit Number 5    Number of Visits 8    Date for PT Re-Evaluation 10/11/23    Authorization Type [place] [employer]    PT Start Time 1145    PT Stop Time 1230    PT Time Calculation (min) 45 min    Activity Tolerance Patient tolerated treatment well    Behavior During Therapy [facility] for tasks assessed/performed                Past Medical History:  Diagnosis Date   Breast cancer, right breast (HCC) June 2010   Poorly differentiated intraductal carcinoma. s/p chemo, radiation and lumpectomy. Triple recepetro negative. BRAC1 adn BRCA2 negative.   Diabetes mellitus, type II (HCC) 57/12   A1c 7.5    Hyperlipidemia 6/31/2011   Taking Simvistatin. LDL 217--127-107   Hypertension 2005   Treated with ACE ([facility] in [place])   Lump or mass in breast    Osteoarthritis of shoulder region 01/29/2011   Personal history of chemotherapy    Personal history of radiation therapy    Past Surgical History:  Procedure Laterality Date   BREAST LUMPECTOMY  Jan 2011   R breast    CHOLECYSTECTOMY N/A 07/07/2022   Procedure: LAPAROSCOPIC CHOLECYSTECTOMY;  Surgeon: Violeta Gelinas, MD;  Location: [facility] OR;  Service: General;  Laterality: N/A;   Patient Active Problem List   Diagnosis Date Noted   Allergies 04/28/2018   Dyslipidemia 03/11/2018   Post-menopausal bleeding 04/14/2017   Allergic rhinitis 02/26/2016   Periodic health assessment, general screening, adult 01/12/2014   Lymphedema Right arm and right breast 07/24/2013   Chronic closed-angle glaucoma 07/12/2012   HTN (hypertension) 12/11/2010   Hyperlipidemia 12/11/2010   History of breast cancer 12/11/2010    PCP: Gwenlyn Found, MD  REFERRING PROVIDER: Pollyann Savoy, MD  REFERRING DIAG: M21.70 (ICD-10-CM) - Leg length  discrepancy R26.89 (ICD-10-CM) - Poor balance  Rationale for Evaluation and Treatment: Rehabilitation  THERAPY DIAG:  Other low back pain  ONSET DATE: > 6 months  SUBJECTIVE:                                                                                                                                                                                           SUBJECTIVE STATEMENT: Notes ongoing issue of back pain which seems less but ongoing LLE discomfort seemingly with peripheralization and centralization  PERTINENT HISTORY:  According the patient her symptoms started  many years ago. She states about 20 years ago she was told that she had spinal stenosis while she was living in [place]. She states she has had lower back pain since then which has been worse in the last 3 years  PAIN:  Are you having pain? Yes: NPRS scale: 7/10 Pain location: B LE Pain description: burning some times sharp pain in LLE Aggravating factors: prolonged standing/walking Relieving factors: seated rest  PRECAUTIONS: None  RED FLAGS: None   WEIGHT BEARING RESTRICTIONS: No  FALLS:  Has patient fallen in last 6 months? Yes. Number of falls slip and fall at home, injuring RLE which has improved  LIVING ENVIRONMENT: Lives with: lives with their spouse Lives in: House/apartment Stairs: yes Has following equipment at home: None  OCCUPATION:   PLOF: Independent  PATIENT GOALS: reduce back pain  NEXT MD VISIT: as needed  OBJECTIVE:   TODAY'S TREATMENT: 09/23/23 Activity Comments  Review of lumbar spine anatomy Explanation of spondylolisthesis  HEP review   Hooklying posterior pelvic tilt 2x10, cues for sequence/engagement  Front plank on knees 5x5 sec Cues for position/bracing  Stiff arm pulldown 2x10 (bilat should ext with bar) 15# for TrAb recruitment  Paloff press 1x10 15#--cues for position, improved position and carryover  Seated lat row 2x10 35#      TODAY'S TREATMENT:  09/13/23 Activity Comments  LTR  Cueing for pacing   SKTC 30" Good tolerance  Supine fig 4, KTOS 2x30" each Good tolerance  Open book stretch 10x each Verbal and manual cueing for positioning   standing paloff press pulley 10# 2x10 each side R-ward lean and tendency to lean forward slightly with each rep  standing shoulder extension pulley 2x10 20# Cueing to reduce pace d/t limited control; cues to contract core     PATIENT EDUCATION: Education details: discussion on progress towards goals and remaining impairments, POC, pt's schedule; encouraged return back to routine with HEP  Person educated: Patient Education method: Explanation, Demonstration, Tactile cues, Verbal cues, and Handouts Education comprehension: verbalized understanding and returned demonstration    HOME EXERCISE PROGRAM Last updated: 08/16/23 Access Code: [email or web address] URL: [email or web address] Date: 08/16/2023 Prepared by: [facility] - Outpatient  Rehab - [facility]  Exercises - Hooklying Isometric Hip Flexion  - 1 x daily - 7 x weekly - 1-3 sets - 10 reps - 2 sec hold - Seated Pelvic Tilt  - 1 x daily - 5 x weekly - 2 sets - 10 reps - Sidelying Hip Abduction  - 1 x daily - 5 x weekly - 2 sets - 10 reps - Supine Piriformis Stretch with Foot on Ground  - 1 x daily - 5 x weekly - 2 sets - 30 sec hold - Supine Figure 4 Piriformis Stretch  - 1 x daily - 5 x weekly - 2 sets - 30 sec hold - Plank on Knees  - 1 x daily - 7 x weekly - 5 reps - 5-10 sec hold - Standard Plank  - 1 x daily - 7 x weekly - 5 reps - 10-30 sec hold     Note: Objective measures were completed at Evaluation unless otherwise noted.  DIAGNOSTIC FINDINGS:  L3-L4 and L4-L5 disc space narrowing was noted.  L4-L5 spondylolisthesis  was noted.  Facet joint arthropathy was noted.   Impression: These findings suggestive of lumbar spondylosis,  spondylolisthesis and facet joint arthropathy.   SENSATION: Not tested  MUSCLE  LENGTH: WFL  POSTURE: weight shift right  Leg Length: LLE 35"  ASIS-medial malleolus 34.5" RLE ASIS-medial mal  PALPATION: Some tenderness to lumbar spine at joint lines and tenderness bilat greater trochanter  LUMBAR ROM:   AROM eval  Flexion WNL  Extension WNL  Right lateral flexion WNL  Left lateral flexion WNL  Right rotation WNL  Left rotation WNL   (Blank rows = not tested)  Increased discomfort to extension and left lateral flexion  LOWER EXTREMITY ROM:     Active  Right eval Left eval  Hip flexion WNL WNL  Hip extension    Hip abduction WNL WNL  Hip adduction    Hip internal rotation    Hip external rotation    Knee flexion WNL WNL  Knee extension WNL WNL  Ankle dorsiflexion WNL WNL  Ankle plantarflexion    Ankle inversion    Ankle eversion     (Blank rows = not tested)  LOWER EXTREMITY MMT:    MMT Right eval Left eval  Hip flexion 5 5  Hip extension    Hip abduction 4 4  Hip adduction    Hip internal rotation    Hip external rotation    Knee flexion 5 5  Knee extension 5 5  Ankle dorsiflexion 5 5  Ankle plantarflexion    Ankle inversion    Ankle eversion     (Blank rows = not tested)  Trunk flexion: 2+/5  LUMBAR SPECIAL TESTS:  Single leg stance 10 sec LLE 5-10 RLE SLR + LLE -RLE  FUNCTIONAL TESTS:    GAIT: Distance walked:  Assistive device utilized: None Level of assistance: Complete Independence Comments:   TREATMENT DATE: 08/12/23                                                                                                                                  ASSESSMENT:  CLINICAL IMPRESSION: Review of initial HEP with excellent recall. Progression for trunk strength/stabilization to improve core engagement from posterior pelvic tilt to instruction in modified front plank with good control demonstrated following initial cues for position/posture but with limited endurance only tolerating 5 sec hold.  Demo of progression for  modified to full front plank and explanation of longer static hold to improve core muscles endurance for enhanced stability. Resistance training via compound movements to increase core strength/coordination with good form demonstrtaed throughout. Continued sessions to progress to core strength + functional lifting  OBJECTIVE IMPAIRMENTS: decreased activity tolerance, decreased balance, difficulty walking, decreased strength, improper body mechanics, and pain.   ACTIVITY LIMITATIONS: carrying, lifting, bending, standing, and locomotion level  PARTICIPATION LIMITATIONS: meal prep, cleaning, shopping, and community activity  PERSONAL FACTORS: Age and Time since onset of injury/illness/exacerbation are also affecting patient's functional outcome.   REHAB POTENTIAL: Fair due to time since onset and brief POC due to patient scheduling  CLINICAL DECISION MAKING: Stable/uncomplicated  EVALUATION COMPLEXITY: Low   GOALS: Goals reviewed with patient? Yes  SHORT TERM GOALS: Target date: same as LTG  LONG TERM GOALS: Target date: 10/11/2023    Demo independence with HEP for core/trunk/hip abd strengthening to improve lumbar stability Baseline: met for current 08/18/23; reports she has not been able d/t travel and food poisoning 09/13/23  Goal status: IN PROGRESS 09/13/23   2.  Teach-back techniques and principles to reduce back pain with prolonged sitting/standing Baseline: edu provided this date 08/18/23; reports increased pain levels since travel 09/13/23 Goal status: IN PROGRESS 09/13/23  3.  Demo proper body mechanics for picking up items/objects from ground to reduce risk for back strain Baseline: edu provided this date 08/18/23 Goal status: IN PROGRESS  08/18/23    PLAN:  PT FREQUENCY: 1x/week  PT DURATION: 4 weeks  PLANNED INTERVENTIONS: 97110-Therapeutic exercises, 97530- Therapeutic activity, 97112- Neuromuscular re-education, 97535- Self Care, 16109- Manual therapy, [phone]- Gait  training, [phone]- Canalith repositioning, [phone]- Aquatic Therapy, Patient/Family education, Balance training, Stair training, Joint mobilization, Spinal mobilization, and DME instructions.  PLAN FOR NEXT SESSION: continue core strengthening, functional lifts   12:36 PM, 09/23/23 M. Shary Decamp, PT, DPT Physical Therapist- [place] Office Number: [phone]

## 2023-09-29 NOTE — Therapy (Signed)
 OUTPATIENT PHYSICAL THERAPY THORACOLUMBAR TREATMENT   Patient Name: Carol Ward MRN: [id number] DOB:03-08-1961, 63 y.o., female Today's Date: 09/30/2023  END OF SESSION:  PT End of Session - 09/30/23 1230     Visit Number 6    Number of Visits 8    Date for PT Re-Evaluation 10/11/23    Authorization Type [place] [employer]    PT Start Time 1146    PT Stop Time 1228    PT Time Calculation (min) 42 min    Activity Tolerance Patient tolerated treatment well    Behavior During Therapy [facility] for tasks assessed/performed                 Past Medical History:  Diagnosis Date   Breast cancer, right breast (HCC) June 2010   Poorly differentiated intraductal carcinoma. s/p chemo, radiation and lumpectomy. Triple recepetro negative. BRAC1 adn BRCA2 negative.   Diabetes mellitus, type II (HCC) 57/12   A1c 7.5    Hyperlipidemia 6/31/2011   Taking Simvistatin. LDL 217--127-107   Hypertension 2005   Treated with ACE ([facility] in [place])   Lump or mass in breast    Osteoarthritis of shoulder region 01/29/2011   Personal history of chemotherapy    Personal history of radiation therapy    Past Surgical History:  Procedure Laterality Date   BREAST LUMPECTOMY  Jan 2011   R breast    CHOLECYSTECTOMY N/A 07/07/2022   Procedure: LAPAROSCOPIC CHOLECYSTECTOMY;  Surgeon: Violeta Gelinas, MD;  Location: [facility] OR;  Service: General;  Laterality: N/A;   Patient Active Problem List   Diagnosis Date Noted   Allergies 04/28/2018   Dyslipidemia 03/11/2018   Post-menopausal bleeding 04/14/2017   Allergic rhinitis 02/26/2016   Periodic health assessment, general screening, adult 01/12/2014   Lymphedema Right arm and right breast 07/24/2013   Chronic closed-angle glaucoma 07/12/2012   HTN (hypertension) 12/11/2010   Hyperlipidemia 12/11/2010   History of breast cancer 12/11/2010    PCP: Gwenlyn Found, MD  REFERRING PROVIDER: Pollyann Savoy, MD  REFERRING DIAG: M21.70 (ICD-10-CM) - Leg  length discrepancy R26.89 (ICD-10-CM) - Poor balance  Rationale for Evaluation and Treatment: Rehabilitation  THERAPY DIAG:  Other low back pain  ONSET DATE: > 6 months  SUBJECTIVE:                                                                                                                                                                                           SUBJECTIVE STATEMENT: Reports that back is better, but the R posterior thigh is still bothering her.   PERTINENT HISTORY:  According the patient her symptoms started many years  ago. She states about 20 years ago she was told that she had spinal stenosis while she was living in [place]. She states she has had lower back pain since then which has been worse in the last 3 years  PAIN:  Are you having pain? Yes: NPRS scale: 0/10 Pain location: B LE Pain description: burning some times sharp pain in LLE Aggravating factors: prolonged standing/walking Relieving factors: seated rest  PRECAUTIONS: None  RED FLAGS: None   WEIGHT BEARING RESTRICTIONS: No  FALLS:  Has patient fallen in last 6 months? Yes. Number of falls slip and fall at home, injuring RLE which has improved  LIVING ENVIRONMENT: Lives with: lives with their spouse Lives in: House/apartment Stairs: yes Has following equipment at home: None  OCCUPATION:   PLOF: Independent  PATIENT GOALS: reduce back pain  NEXT MD VISIT: as needed  OBJECTIVE:     TODAY'S TREATMENT: 09/30/23 Activity Comments  Nustep L6 x 6 min UEs/LEs  Maintaining ~60SPM  palpation TTP over R IT band; no soft tissue restriction   Supine TFL stretch with strap 2x30" each  Pt reports "stretch but not too much"; cueing for proper positioning   goblet squat 10# 2x10 Use of mirror feedback and verbal/manual cues to shift onto L LE as patient is avoiding wt bearing on this side   high knee march suitcase carry 10# 4x55ft; CGA d/t imbalance            HOME EXERCISE  PROGRAM Access Code: [email or web address] URL: [email or web address] Date: 09/30/2023 Prepared by: [facility] - Outpatient  Rehab - [facility]  Exercises - Hooklying Isometric Hip Flexion  - 1 x daily - 7 x weekly - 1-3 sets - 10 reps - 2 sec hold - Seated Pelvic Tilt  - 1 x daily - 5 x weekly - 2 sets - 10 reps - Sidelying Hip Abduction  - 1 x daily - 5 x weekly - 2 sets - 10 reps - Supine Piriformis Stretch with Foot on Ground  - 1 x daily - 5 x weekly - 2 sets - 30 sec hold - Supine Figure 4 Piriformis Stretch  - 1 x daily - 5 x weekly - 2 sets - 30 sec hold - Plank on Knees  - 1 x daily - 7 x weekly - 5 reps - 5-10 sec hold - Standard Plank  - 1 x daily - 7 x weekly - 5 reps - 10-30 sec hold - Supine ITB Stretch with Strap  - 1 x daily - 5 x weekly - 2 sets - 30 sec hold    PATIENT EDUCATION: Education details: HEP update, discussed pt's tolerance for PT activities thus far, discussed POC and possible DC on last scheduled appt, answered pt's questions  Person educated: Patient Education method: Explanation, Demonstration, Tactile cues, Verbal cues, and Handouts Education comprehension: verbalized understanding and returned demonstration      Note: Objective measures were completed at Evaluation unless otherwise noted.  DIAGNOSTIC FINDINGS:  L3-L4 and L4-L5 disc space narrowing was noted.  L4-L5 spondylolisthesis  was noted.  Facet joint arthropathy was noted.   Impression: These findings suggestive of lumbar spondylosis,  spondylolisthesis and facet joint arthropathy.   SENSATION: Not tested  MUSCLE LENGTH: WFL  POSTURE: weight shift right  Leg Length: LLE 35" ASIS-medial malleolus 34.5" RLE ASIS-medial mal  PALPATION: Some tenderness to lumbar spine at joint lines and tenderness bilat greater trochanter  LUMBAR ROM:   AROM eval  Flexion WNL  Extension WNL  Right lateral flexion WNL  Left lateral flexion WNL  Right rotation WNL  Left rotation WNL    (Blank rows = not tested)  Increased discomfort to extension and left lateral flexion  LOWER EXTREMITY ROM:     Active  Right eval Left eval  Hip flexion WNL WNL  Hip extension    Hip abduction WNL WNL  Hip adduction    Hip internal rotation    Hip external rotation    Knee flexion WNL WNL  Knee extension WNL WNL  Ankle dorsiflexion WNL WNL  Ankle plantarflexion    Ankle inversion    Ankle eversion     (Blank rows = not tested)  LOWER EXTREMITY MMT:    MMT Right eval Left eval  Hip flexion 5 5  Hip extension    Hip abduction 4 4  Hip adduction    Hip internal rotation    Hip external rotation    Knee flexion 5 5  Knee extension 5 5  Ankle dorsiflexion 5 5  Ankle plantarflexion    Ankle inversion    Ankle eversion     (Blank rows = not tested)  Trunk flexion: 2+/5  LUMBAR SPECIAL TESTS:  Single leg stance 10 sec LLE 5-10 RLE SLR + LLE -RLE  FUNCTIONAL TESTS:    GAIT: Distance walked:  Assistive device utilized: None Level of assistance: Complete Independence Comments:   TREATMENT DATE: 08/12/23                                                                                                                                  ASSESSMENT:  CLINICAL IMPRESSION: Patient arrived to session with report of improving LBP but remaining R posterior thigh discomfort. Patient TTP over R ITB, thus worked on TFL stretching which was well-tolerated. Proceeded with LE and core weighted strengthening, with focus on form as patient tends to off-load the L LE. She explains that d/t a previous ankle injury she has leaned to avoid this side. HEP was updated with stretching that was well-tolerated today. No complaints at end of session.   OBJECTIVE IMPAIRMENTS: decreased activity tolerance, decreased balance, difficulty walking, decreased strength, improper body mechanics, and pain.   ACTIVITY LIMITATIONS: carrying, lifting, bending, standing, and locomotion  level  PARTICIPATION LIMITATIONS: meal prep, cleaning, shopping, and community activity  PERSONAL FACTORS: Age and Time since onset of injury/illness/exacerbation are also affecting patient's functional outcome.   REHAB POTENTIAL: Fair due to time since onset and brief POC due to patient scheduling  CLINICAL DECISION MAKING: Stable/uncomplicated  EVALUATION COMPLEXITY: Low   GOALS: Goals reviewed with patient? Yes  SHORT TERM GOALS: Target date: same as LTG    LONG TERM GOALS: Target date: 10/11/2023    Demo independence with HEP for core/trunk/hip abd strengthening to improve lumbar stability Baseline: met for current 08/18/23; reports she has not been able d/t travel and food poisoning 09/13/23  Goal status: IN PROGRESS 09/13/23  2.  Teach-back techniques and principles to reduce back pain with prolonged sitting/standing Baseline: edu provided this date 08/18/23; reports increased pain levels since travel 09/13/23 Goal status: IN PROGRESS 09/13/23  3.  Demo proper body mechanics for picking up items/objects from ground to reduce risk for back strain Baseline: edu provided this date 08/18/23 Goal status: IN PROGRESS  08/18/23    PLAN:  PT FREQUENCY: 1x/week  PT DURATION: 4 weeks  PLANNED INTERVENTIONS: 97110-Therapeutic exercises, 97530- Therapeutic activity, 97112- Neuromuscular re-education, 97535- Self Care, [id number]- Manual therapy, [phone]- Gait training, [phone]- Canalith repositioning, [phone]- Aquatic Therapy, Patient/Family education, Balance training, Stair training, Joint mobilization, Spinal mobilization, and DME instructions.  PLAN FOR NEXT SESSION: continue core strengthening, functional lifts   Baldemar Friday, [place], DPT 09/30/23 12:33 PM  [facility] at [facility] [address] [place], [address] [place], [place] [address] Phone # [phone] Fax # [phone]

## 2023-09-30 ENCOUNTER — Encounter: Payer: Self-pay | Admitting: Physical Therapy

## 2023-09-30 ENCOUNTER — Ambulatory Visit: Payer: Medicaid Other | Attending: Rheumatology | Admitting: Physical Therapy

## 2023-09-30 DIAGNOSIS — M5459 Other low back pain: Secondary | ICD-10-CM | POA: Diagnosis present

## 2023-10-05 NOTE — Therapy (Signed)
 OUTPATIENT PHYSICAL THERAPY THORACOLUMBAR TREATMENT   Patient Name: Carol Ward MRN: [id number] DOB:[birth year], 63 y.o., female Today's Date: 10/07/2023  END OF SESSION:  PT End of Session - 10/07/23 1225     Visit Number 7    Number of Visits 8    Date for PT Re-Evaluation 10/11/23    Authorization Type [place] [employer]    PT Start Time 1146    PT Stop Time 1228    PT Time Calculation (min) 42 min    Activity Tolerance Patient tolerated treatment well    Behavior During Therapy [facility] for tasks assessed/performed                  Past Medical History:  Diagnosis Date   Breast cancer, right breast (HCC) June 2010   Poorly differentiated intraductal carcinoma. s/p chemo, radiation and lumpectomy. Triple recepetro negative. BRAC1 adn BRCA2 negative.   Diabetes mellitus, type II (HCC) 57/12   A1c 7.5    Hyperlipidemia 6/31/2011   Taking Simvistatin. LDL 217--127-107   Hypertension 2005   Treated with ACE ([facility] in [place])   Lump or mass in breast    Osteoarthritis of shoulder region 01/29/2011   Personal history of chemotherapy    Personal history of radiation therapy    Past Surgical History:  Procedure Laterality Date   BREAST LUMPECTOMY  Jan 2011   R breast    CHOLECYSTECTOMY N/A 07/07/2022   Procedure: LAPAROSCOPIC CHOLECYSTECTOMY;  Surgeon: Violeta Gelinas, MD;  Location: [facility] OR;  Service: General;  Laterality: N/A;   Patient Active Problem List   Diagnosis Date Noted   Allergies 04/28/2018   Dyslipidemia 03/11/2018   Post-menopausal bleeding 04/14/2017   Allergic rhinitis 02/26/2016   Periodic health assessment, general screening, adult 01/12/2014   Lymphedema Right arm and right breast 07/24/2013   Chronic closed-angle glaucoma 07/12/2012   HTN (hypertension) 12/11/2010   Hyperlipidemia 12/11/2010   History of breast cancer 12/11/2010    PCP: Gwenlyn Found, MD  REFERRING PROVIDER: Pollyann Savoy, MD  REFERRING DIAG: M21.70 (ICD-10-CM) - Leg  length discrepancy R26.89 (ICD-10-CM) - Poor balance  Rationale for Evaluation and Treatment: Rehabilitation  THERAPY DIAG:  Other low back pain  ONSET DATE: > 6 months  SUBJECTIVE:                                                                                                                                                                                           SUBJECTIVE STATEMENT: "Good. But last night bad." Reports that she was having pain in the L lateral lower leg last night. Reports that the lateral thigh pain that  she experiences last session is improved.   PERTINENT HISTORY:  According the patient her symptoms started many years ago. She states about 20 years ago she was told that she had spinal stenosis while she was living in [place]. She states she has had lower back pain since then which has been worse in the last 3 years  PAIN:  Are you having pain? Yes: NPRS scale: 4-5/10 Pain location: B LE Pain description: burning some times sharp pain in LLE Aggravating factors: prolonged standing/walking Relieving factors: seated rest  PRECAUTIONS: None  RED FLAGS: None   WEIGHT BEARING RESTRICTIONS: No  FALLS:  Has patient fallen in last 6 months? Yes. Number of falls slip and fall at home, injuring RLE which has improved  LIVING ENVIRONMENT: Lives with: lives with their spouse Lives in: House/apartment Stairs: yes Has following equipment at home: None  OCCUPATION:   PLOF: Independent  PATIENT GOALS: reduce back pain  NEXT MD VISIT: as needed  OBJECTIVE:    TODAY'S TREATMENT: 10/07/23 Activity Comments  Nustep L6 x 6 min UEs/LEs  Maintaining ~ 50-60 SPM. Pt reports some L lower leg pain after completing warmup   L gastroc stretch 2x30"   sidestepping with TB around ankles 2x27ft  Heavy cueing for soft knees, avoiding lateral trunk lean.   standing donkey kicks 2x10 each LE  Cueing for form   Palpation  No particular TTP or pain over the L calf; no  redness/warmth   15# kettle bell deadlift 10x  Cueing for hip hinge and knee bend rather than reliance on UEs/LB. Demo and verbal cues required  standing resisted trunk rotation with green TB  Cues for slow eccentric phase; tolerated well         PATIENT EDUCATION: Education details: discussed discharge next session- patient agreeable  Person educated: Patient Education method: Explanation Education comprehension: verbalized understanding    HOME EXERCISE PROGRAM Access Code: [email or web address] URL: [email or web address] Date: 09/30/2023 Prepared by: [facility] - Outpatient  Rehab - [facility]  Exercises - Hooklying Isometric Hip Flexion  - 1 x daily - 7 x weekly - 1-3 sets - 10 reps - 2 sec hold - Seated Pelvic Tilt  - 1 x daily - 5 x weekly - 2 sets - 10 reps - Sidelying Hip Abduction  - 1 x daily - 5 x weekly - 2 sets - 10 reps - Supine Piriformis Stretch with Foot on Ground  - 1 x daily - 5 x weekly - 2 sets - 30 sec hold - Supine Figure 4 Piriformis Stretch  - 1 x daily - 5 x weekly - 2 sets - 30 sec hold - Plank on Knees  - 1 x daily - 7 x weekly - 5 reps - 5-10 sec hold - Standard Plank  - 1 x daily - 7 x weekly - 5 reps - 10-30 sec hold - Supine ITB Stretch with Strap  - 1 x daily - 5 x weekly - 2 sets - 30 sec hold      Note: Objective measures were completed at Evaluation unless otherwise noted.  DIAGNOSTIC FINDINGS:  L3-L4 and L4-L5 disc space narrowing was noted.  L4-L5 spondylolisthesis  was noted.  Facet joint arthropathy was noted.   Impression: These findings suggestive of lumbar spondylosis,  spondylolisthesis and facet joint arthropathy.   SENSATION: Not tested  MUSCLE LENGTH: WFL  POSTURE: weight shift right  Leg Length: LLE 35" ASIS-medial malleolus 34.5" RLE ASIS-medial mal  PALPATION: Some tenderness to lumbar  spine at joint lines and tenderness bilat greater trochanter  LUMBAR ROM:   AROM eval  Flexion WNL  Extension WNL   Right lateral flexion WNL  Left lateral flexion WNL  Right rotation WNL  Left rotation WNL   (Blank rows = not tested)  Increased discomfort to extension and left lateral flexion  LOWER EXTREMITY ROM:     Active  Right eval Left eval  Hip flexion WNL WNL  Hip extension    Hip abduction WNL WNL  Hip adduction    Hip internal rotation    Hip external rotation    Knee flexion WNL WNL  Knee extension WNL WNL  Ankle dorsiflexion WNL WNL  Ankle plantarflexion    Ankle inversion    Ankle eversion     (Blank rows = not tested)  LOWER EXTREMITY MMT:    MMT Right eval Left eval  Hip flexion 5 5  Hip extension    Hip abduction 4 4  Hip adduction    Hip internal rotation    Hip external rotation    Knee flexion 5 5  Knee extension 5 5  Ankle dorsiflexion 5 5  Ankle plantarflexion    Ankle inversion    Ankle eversion     (Blank rows = not tested)  Trunk flexion: 2+/5  LUMBAR SPECIAL TESTS:  Single leg stance 10 sec LLE 5-10 RLE SLR + LLE -RLE  FUNCTIONAL TESTS:    GAIT: Distance walked:  Assistive device utilized: None Level of assistance: Complete Independence Comments:   TREATMENT DATE: 08/12/23                                                                                                                                  ASSESSMENT:  CLINICAL IMPRESSION: Patient arrived to session with report of some L lateral lower leg pain last night, however reports improvement in the lateral thigh pain that she was reporting last session. Patient performed resisted hip and posterior chain strengthening with cueing and demo for proper form. Patient continued to c/o this lower leg pain throughout session but nothing in particular seemed to aggravate it; no TTP, particular tightness, redness/warmth in the calf. Patient was able to perform weighted deadlifts with good form today and no LBP. She reported improvement in the L lower leg pain at end of session.    OBJECTIVE  IMPAIRMENTS: decreased activity tolerance, decreased balance, difficulty walking, decreased strength, improper body mechanics, and pain.   ACTIVITY LIMITATIONS: carrying, lifting, bending, standing, and locomotion level  PARTICIPATION LIMITATIONS: meal prep, cleaning, shopping, and community activity  PERSONAL FACTORS: Age and Time since onset of injury/illness/exacerbation are also affecting patient's functional outcome.   REHAB POTENTIAL: Fair due to time since onset and brief POC due to patient scheduling  CLINICAL DECISION MAKING: Stable/uncomplicated  EVALUATION COMPLEXITY: Low   GOALS: Goals reviewed with patient? Yes  SHORT TERM GOALS: Target date: same as LTG  LONG TERM GOALS: Target date: 10/11/2023    Demo independence with HEP for core/trunk/hip abd strengthening to improve lumbar stability Baseline: met for current 08/18/23; reports she has not been able d/t travel and food poisoning 09/13/23  Goal status: IN PROGRESS 09/13/23   2.  Teach-back techniques and principles to reduce back pain with prolonged sitting/standing Baseline: edu provided this date 08/18/23; reports increased pain levels since travel 09/13/23 Goal status: IN PROGRESS 09/13/23  3.  Demo proper body mechanics for picking up items/objects from ground to reduce risk for back strain Baseline: edu provided this date 08/18/23 Goal status: IN PROGRESS  08/18/23    PLAN:  PT FREQUENCY: 1x/week  PT DURATION: 4 weeks  PLANNED INTERVENTIONS: 97110-Therapeutic exercises, 97530- Therapeutic activity, 97112- Neuromuscular re-education, 97535- Self Care, 40981- Manual therapy, [phone]- Gait training, [phone]- Canalith repositioning, [phone]- Aquatic Therapy, Patient/Family education, Balance training, Stair training, Joint mobilization, Spinal mobilization, and DME instructions.  PLAN FOR NEXT SESSION: DC   Baldemar Friday, PT, DPT 10/07/23 12:30 PM  [facility] at [facility] [address] [place], [address] [place], [place] [address] Phone # [phone] Fax # [phone]

## 2023-10-07 ENCOUNTER — Encounter: Payer: Self-pay | Admitting: Physical Therapy

## 2023-10-07 ENCOUNTER — Ambulatory Visit: Payer: Medicaid Other | Admitting: Physical Therapy

## 2023-10-07 DIAGNOSIS — M5459 Other low back pain: Secondary | ICD-10-CM | POA: Diagnosis not present

## 2023-10-07 NOTE — Therapy (Signed)
 OUTPATIENT PHYSICAL THERAPY THORACOLUMBAR  DISCHARGE   Patient Name: Carol Ward MRN: [id number] DOB:09/18/1960, 63 y.o., female Today's Date: 10/11/2023  END OF SESSION:  PT End of Session - 10/11/23 1222     Visit Number 8    Number of Visits 8    Date for PT Re-Evaluation 10/11/23    Authorization Type [place] [employer]    PT Start Time 1144    PT Stop Time 1217    PT Time Calculation (min) 33 min    Activity Tolerance Patient tolerated treatment well    Behavior During Therapy [facility] for tasks assessed/performed                  Past Medical History:  Diagnosis Date   Breast cancer, right breast (HCC) June 2010   Poorly differentiated intraductal carcinoma. s/p chemo, radiation and lumpectomy. Triple recepetro negative. BRAC1 adn BRCA2 negative.   Diabetes mellitus, type II (HCC) 57/12   A1c 7.5    Hyperlipidemia 6/31/2011   Taking Simvistatin. LDL 217--127-107   Hypertension 2005   Treated with ACE ([facility] in [place])   Lump or mass in breast    Osteoarthritis of shoulder region 01/29/2011   Personal history of chemotherapy    Personal history of radiation therapy    Past Surgical History:  Procedure Laterality Date   BREAST LUMPECTOMY  Jan 2011   R breast    CHOLECYSTECTOMY N/A 07/07/2022   Procedure: LAPAROSCOPIC CHOLECYSTECTOMY;  Surgeon: Violeta Gelinas, MD;  Location: [facility] OR;  Service: General;  Laterality: N/A;   Patient Active Problem List   Diagnosis Date Noted   Allergies 04/28/2018   Dyslipidemia 03/11/2018   Post-menopausal bleeding 04/14/2017   Allergic rhinitis 02/26/2016   Periodic health assessment, general screening, adult 01/12/2014   Lymphedema Right arm and right breast 07/24/2013   Chronic closed-angle glaucoma 07/12/2012   HTN (hypertension) 12/11/2010   Hyperlipidemia 12/11/2010   History of breast cancer 12/11/2010    PCP: Gwenlyn Found, MD  REFERRING PROVIDER: Pollyann Savoy, MD  REFERRING DIAG: M21.70 (ICD-10-CM) - Leg  length discrepancy R26.89 (ICD-10-CM) - Poor balance  Rationale for Evaluation and Treatment: Rehabilitation  THERAPY DIAG:  Other low back pain  ONSET DATE: > 6 months  SUBJECTIVE:                                                                                                                                                                                           SUBJECTIVE STATEMENT: Reports that she had some back pain. Reports that one of the stretches was difficult to perform on the bed. Agreeable to proceed with DC  today.   PERTINENT HISTORY:  According the patient her symptoms started many years ago. She states about 20 years ago she was told that she had spinal stenosis while she was living in [place]. She states she has had lower back pain since then which has been worse in the last 3 years  PAIN:  Are you having pain? Yes: NPRS scale: 4-5/10 Pain location: lateral R thigh Pain description: burning some times sharp pain in LLE Aggravating factors: prolonged standing/walking Relieving factors: seated rest, Tylenol   PRECAUTIONS: None  RED FLAGS: None   WEIGHT BEARING RESTRICTIONS: No  FALLS:  Has patient fallen in last 6 months? Yes. Number of falls slip and fall at home, injuring RLE which has improved  LIVING ENVIRONMENT: Lives with: lives with their spouse Lives in: House/apartment Stairs: yes Has following equipment at home: None  OCCUPATION:   PLOF: Independent  PATIENT GOALS: reduce back pain  NEXT MD VISIT: as needed  OBJECTIVE:    TODAY'S TREATMENT: 10/11/23 Activity Comments  Nustep L6 x 6 min UEs/LEs  Maintaining ~ 57 SPM  Detailed review of HEP: - Hooklying Isometric Hip Flexion 5x5" each LE  - Seated Pelvic Tilt 10x  - Sidelying Hip Abduction 10x each - Supine Piriformis Stretch with Foot on Ground 30" each  - Supine Figure 4 Piriformis Stretch 30" each  - Supine ITB Stretch with Strap 30" each  - Plank on Knees 10" hold - Standard  Plank 10" hold Great carryover with stretches. Required cueing and demo for iso hip flexion, correction of LE positioning with abduction   Bridge with ball squeeze  Good form and tolerance       HOME EXERCISE PROGRAM Access Code: [email or web address] URL: [email or web address] Date: 10/11/2023 Prepared by: [facility] - Outpatient  Rehab - [facility]  Exercises - Supine Figure 4 Piriformis Stretch  - 1 x daily - 5 x weekly - 2 sets - 30 sec hold - Supine Piriformis Stretch with Foot on Ground  - 1 x daily - 5 x weekly - 2 sets - 30 sec hold - Supine ITB Stretch with Strap  - 1 x daily - 5 x weekly - 2 sets - 30 sec hold - Supine Bridge with Mini Swiss Ball Between Knees  - 1 x daily - 5 x weekly - 2 sets - 10 reps - Hooklying Isometric Hip Flexion  - 1 x daily - 7 x weekly - 1-3 sets - 10 reps - 5 sec hold - Seated Pelvic Tilt  - 1 x daily - 5 x weekly - 2 sets - 10 reps - Sidelying Hip Abduction  - 1 x daily - 5 x weekly - 2 sets - 10 reps - Plank on Knees  - 1 x daily - 7 x weekly - 5 reps - 5-10 sec hold - Standard Plank  - 1 x daily - 7 x weekly - 5 reps - 10-30 sec hold    PATIENT EDUCATION: Education details: HEP review and update; discussed slow transition to gym for continued fitness; reviewed body mechanics principles and avoiding prolonged positioning; advised to return with new referral if something new comes up and she wants to return to therapy  Person educated: Patient Education method: Explanation, Demonstration, Tactile cues, Verbal cues, and Handouts Education comprehension: verbalized understanding and returned demonstration    Note: Objective measures were completed at Evaluation unless otherwise noted.  DIAGNOSTIC FINDINGS:  L3-L4 and L4-L5 disc space narrowing was noted.  L4-L5 spondylolisthesis  was  noted.  Facet joint arthropathy was noted.   Impression: These findings suggestive of lumbar spondylosis,  spondylolisthesis and facet joint  arthropathy.   SENSATION: Not tested  MUSCLE LENGTH: WFL  POSTURE: weight shift right  Leg Length: LLE 35" ASIS-medial malleolus 34.5" RLE ASIS-medial mal  PALPATION: Some tenderness to lumbar spine at joint lines and tenderness bilat greater trochanter  LUMBAR ROM:   AROM eval  Flexion WNL  Extension WNL  Right lateral flexion WNL  Left lateral flexion WNL  Right rotation WNL  Left rotation WNL   (Blank rows = not tested)  Increased discomfort to extension and left lateral flexion  LOWER EXTREMITY ROM:     Active  Right eval Left eval  Hip flexion WNL WNL  Hip extension    Hip abduction WNL WNL  Hip adduction    Hip internal rotation    Hip external rotation    Knee flexion WNL WNL  Knee extension WNL WNL  Ankle dorsiflexion WNL WNL  Ankle plantarflexion    Ankle inversion    Ankle eversion     (Blank rows = not tested)  LOWER EXTREMITY MMT:    MMT Right eval Left eval  Hip flexion 5 5  Hip extension    Hip abduction 4 4  Hip adduction    Hip internal rotation    Hip external rotation    Knee flexion 5 5  Knee extension 5 5  Ankle dorsiflexion 5 5  Ankle plantarflexion    Ankle inversion    Ankle eversion     (Blank rows = not tested)  Trunk flexion: 2+/5  LUMBAR SPECIAL TESTS:  Single leg stance 10 sec LLE 5-10 RLE SLR + LLE -RLE  FUNCTIONAL TESTS:    GAIT: Distance walked:  Assistive device utilized: None Level of assistance: Complete Independence Comments:   TREATMENT DATE: 08/12/23                                                                                                                                  ASSESSMENT:  CLINICAL IMPRESSION: Patient arrived to session with report of some back pain last night while sleeping. Today c/o R lateral thigh pain today. HEP was reviewed in detail; patient was able to demonstrate good carryover and tolerance. Goals check included review of body mechanics and positioning principles  which patient reported food understanding of. At this time patient is independent with HEP, plans to start going to the gym with her son, and is ready for DC.   OBJECTIVE IMPAIRMENTS: decreased activity tolerance, decreased balance, difficulty walking, decreased strength, improper body mechanics, and pain.   ACTIVITY LIMITATIONS: carrying, lifting, bending, standing, and locomotion level  PARTICIPATION LIMITATIONS: meal prep, cleaning, shopping, and community activity  PERSONAL FACTORS: Age and Time since onset of injury/illness/exacerbation are also affecting patient's functional outcome.   REHAB POTENTIAL: Fair due to time since onset and brief POC due to patient scheduling  CLINICAL DECISION MAKING: Stable/uncomplicated  EVALUATION COMPLEXITY: Low   GOALS: Goals reviewed with patient? Yes  SHORT TERM GOALS: Target date: same as LTG    LONG TERM GOALS: Target date: 10/11/2023    Demo independence with HEP for core/trunk/hip abd strengthening to improve lumbar stability Baseline: met for current 08/18/23; reports she has not been able d/t travel and food poisoning 09/13/23 ; demonstrated good tolerance and carryover 10/12/23 Goal status: MET 10/12/23   2.  Teach-back techniques and principles to reduce back pain with prolonged sitting/standing Baseline: edu provided this date 08/18/23; reports increased pain levels since travel 09/13/23 Goal status: MET 10/12/23   3.  Demo proper body mechanics for picking up items/objects from ground to reduce risk for back strain Baseline: edu provided this date 08/18/23 Goal status: MET 10/12/23      PLAN:  PT FREQUENCY: 1x/week  PT DURATION: 4 weeks  PLANNED INTERVENTIONS: 97110-Therapeutic exercises, 97530- Therapeutic activity, 97112- Neuromuscular re-education, 97535- Self Care, 69629- Manual therapy, [phone]- Gait training, [phone]- Canalith repositioning, [phone]- Aquatic Therapy, Patient/Family education, Balance training, Stair training,  Joint mobilization, Spinal mobilization, and DME instructions.  PLAN FOR NEXT SESSION: DC at this time   PHYSICAL THERAPY DISCHARGE SUMMARY  Visits from Start of Care: 8  Current functional level related to goals / functional outcomes: See above clinical impression    Remaining deficits: Occasional LB and LE pain   Education / Equipment: HEP  Plan: Patient agrees to discharge.  Patient goals were met. Patient is being discharged due to meeting the stated rehab goals.         Baldemar Friday, PT, DPT 10/11/23 12:23 PM  [facility] at [facility] [address] [place], [address] [place], [place] [address] Phone # [phone] Fax # [phone]

## 2023-10-11 ENCOUNTER — Encounter: Payer: Self-pay | Admitting: Physical Therapy

## 2023-10-11 ENCOUNTER — Ambulatory Visit: Admitting: Physical Therapy

## 2023-10-11 DIAGNOSIS — M5459 Other low back pain: Secondary | ICD-10-CM | POA: Diagnosis not present

## 2023-10-14 ENCOUNTER — Ambulatory Visit: Payer: Medicaid Other | Admitting: Physical Therapy

## 2023-11-02 ENCOUNTER — Ambulatory Visit: Payer: Medicaid Other | Admitting: Physical Therapy

## 2023-12-06 DIAGNOSIS — M19041 Primary osteoarthritis, right hand: Secondary | ICD-10-CM | POA: Insufficient documentation

## 2023-12-06 DIAGNOSIS — M48061 Spinal stenosis, lumbar region without neurogenic claudication: Secondary | ICD-10-CM | POA: Insufficient documentation

## 2023-12-06 NOTE — Progress Notes (Signed)
 Office Visit Note  Patient: Carol Ward             Date of Birth: March 16, 1961           MRN: [id number]             PCP: Audria Leather, MD Referring: Audria Leather, MD Visit Date: 12/16/2023 Occupation: @GUAROCC @  Subjective:  Lower back pain and left leg pain   History of Present Illness: Carol Ward is a 63 y.o. female with osteoarthritis and degenerative disc disease.  She states she went for physical therapy which was very helpful.  She was also advised that she had leg length discrepancy which is causing strain on her back.  She describes some ongoing discomfort in the lower back which radiates into her SI joints especially her left SI joint and her left lower extremity.  She has intermittent numbness in her left foot.  She states she has seen orthopedic surgeon in the past and had cortisone injections.  She does not want to have cortisone injections unless absolutely needed.  She also feels some off-and-on discomfort in her left knee joint.  She has not noticed any joint swelling.    Activities of Daily Living:  Patient reports morning stiffness for 0 minutes.   Patient Denies nocturnal pain.  Difficulty dressing/grooming: Reports Difficulty climbing stairs: Reports Difficulty getting out of chair: Reports Difficulty using hands for taps, buttons, cutlery, and/or writing: Reports  Review of Systems  Constitutional:  Negative for fatigue.  HENT:  Negative for mouth sores and mouth dryness.   Eyes:  Positive for dryness.  Respiratory:  Negative for shortness of breath.   Cardiovascular:  Negative for chest pain and palpitations.  Gastrointestinal:  Negative for blood in stool, constipation and diarrhea.  Endocrine: Negative for increased urination.  Genitourinary:  Negative for involuntary urination.  Musculoskeletal:  Positive for joint pain, joint pain, myalgias, muscle weakness, muscle tenderness and myalgias. Negative for gait problem, joint swelling and morning  stiffness.  Skin:  Positive for sensitivity to sunlight. Negative for color change, rash and hair loss.  Allergic/Immunologic: Negative for susceptible to infections.  Neurological:  Negative for dizziness and headaches.  Hematological:  Negative for swollen glands.  Psychiatric/Behavioral:  Negative for depressed mood and sleep disturbance. The patient is not nervous/anxious.     PMFS History:  Patient Active Problem List   Diagnosis Date Noted   Primary osteoarthritis of both hands 12/06/2023   Spinal stenosis of lumbar region without neurogenic claudication 12/06/2023   Allergies 04/28/2018   Dyslipidemia 03/11/2018   Post-menopausal bleeding 04/14/2017   Allergic rhinitis 02/26/2016   Periodic health assessment, general screening, adult 01/12/2014   Lymphedema Right arm and right breast 07/24/2013   Chronic closed-angle glaucoma 07/12/2012   HTN (hypertension) 12/11/2010   Hyperlipidemia 12/11/2010   History of breast cancer 12/11/2010    Past Medical History:  Diagnosis Date   Breast cancer, right breast [facility]) June 2010   Poorly differentiated intraductal carcinoma. s/p chemo, radiation and lumpectomy. Triple recepetro negative. BRAC1 adn BRCA2 negative.   Diabetes mellitus, type II (HCC) 57/12   A1c 7.5    Hyperlipidemia 6/31/2011   Taking Simvistatin. LDL 217--127-107   Hypertension 2005   Treated with ACE ([facility] in [place])   Lump or mass in breast    Osteoarthritis of shoulder region 01/29/2011   Personal history of chemotherapy    Personal history of radiation therapy     History reviewed. No pertinent family history.  Past Surgical History:  Procedure Laterality Date   BREAST LUMPECTOMY  Jan 2011   R breast    CHOLECYSTECTOMY N/A 07/07/2022   Procedure: LAPAROSCOPIC CHOLECYSTECTOMY;  Surgeon: Dorena Gander, MD;  Location: [facility] OR;  Service: General;  Laterality: N/A;   Social History   Social History Narrative   Moved to [place] 7 mos ago.   Lives with  husband [facility] [place]) of 15 years and 49 yo son Aubre [place]).    Originally from [place].             Immunization History  Administered Date(s) Administered   Influenza Split 05/12/2012   Influenza,inj,Quad PF,6+ Mos 07/16/2015   Influenza-Unspecified 10/16/2016   Moderna Sars-Covid-2 Vaccination 10/18/2019, 11/15/2019, 05/03/2020   PFIZER Comirnaty(Gray Top)Covid-19 Tri-Sucrose Vaccine 08/02/2020   Tdap 10/16/2016     Objective: Vital Signs: BP 122/79 (BP Location: Left Arm, Patient Position: Sitting, Cuff Size: Normal)   Pulse 73   Resp 16   Ht 5\' 6"  (1.676 m)   Wt 179 lb 9.6 oz (81.5 kg)   LMP 02/05/2009   BMI 28.99 kg/m    Physical Exam Vitals and nursing note reviewed.  Constitutional:      Appearance: She is well-developed.  HENT:     Head: Normocephalic and atraumatic.  Eyes:     Conjunctiva/sclera: Conjunctivae normal.  Cardiovascular:     Rate and Rhythm: Normal rate and regular rhythm.     Heart sounds: Normal heart sounds.  Pulmonary:     Effort: Pulmonary effort is normal.     Breath sounds: Normal breath sounds.  Abdominal:     General: Bowel sounds are normal.     Palpations: Abdomen is soft.  Musculoskeletal:     Cervical back: Normal range of motion.  Lymphadenopathy:     Cervical: No cervical adenopathy.  Skin:    General: Skin is warm and dry.     Capillary Refill: Capillary refill takes less than 2 seconds.  Neurological:     Mental Status: She is alert and oriented to person, place, and time.  Psychiatric:        Behavior: Behavior normal.      Musculoskeletal Exam: Cervical spine was in good range of motion.  Thoracic and lumbar spine were in good range of motion.  She had some discomfort range of motion of the lumbar spine.  Shoulders, elbows, wrist joints, MCPs PIPs and DIPs with good range of motion with no synovitis.  She has some discomfort range of motion of her right shoulder joint.  Hips, knee joints were in good range of  motion without any warmth swelling or effusion.  She has some crepitus in her left knee joint.  There was no tenderness over ankles or MTPs.  CDAI Exam: CDAI Score: -- Patient Global: --; Provider Global: -- Swollen: --; Tender: -- Joint Exam 12/16/2023   No joint exam has been documented for this visit   There is currently no information documented on the homunculus. Go to the Rheumatology activity and complete the homunculus joint exam.  Investigation: No additional findings.  Imaging: No results found.  Recent Labs: Lab Results  Component Value Date   WBC 8.3 07/07/2022   HGB 14.6 07/07/2022   PLT 305 07/07/2022   NA 140 07/07/2022   K 3.2 (L) 07/07/2022   CL 103 07/07/2022   CO2 26 07/07/2022   GLUCOSE 96 07/07/2022   BUN 9 07/07/2022   CREATININE 0.87 07/07/2022   BILITOT 0.4 07/07/2022  ALKPHOS 66 07/07/2022   AST 19 07/07/2022   ALT 18 07/07/2022   PROT 7.3 07/07/2022   ALBUMIN 4.0 07/07/2022   CALCIUM  9.6 07/07/2022   GFRAA >60 02/06/2019    Speciality Comments: No specialty comments available.  Procedures:  No procedures performed Allergies: Patient has no known allergies.   Assessment / Plan:     Visit Diagnoses: Polyarthralgia-she continues to have pain and discomfort in multiple joints.  No joint swelling or synovitis was noted on the examination.  Acute pain of right shoulder -she noted improvement in the right shoulder joint pain after physical therapy.  X-rays obtained at the last visit showed humeral spurring.  X-ray findings were reviewed with the patient.  Patient was referred to PT.  Primary osteoarthritis of both hands -she gives history of a stiffness in her hands.  No synovitis was noted.  Joint protection muscle strengthening was discussed at the last visit.  She has been doing some of the exercises.  Clinical findings were suggestive of osteoarthritis.  Trochanteric bursitis, left hip-she has been having some tenderness over the left  trochanteric bursa.  IT band stretches were demonstrated and discussed.  Chronic pain of left ankle - She is followed by sports medicine.  She has intermittent discomfort in her left ankle.  X-rays in December were unremarkable.  Spinal stenosis of lumbar region without neurogenic claudication - Patient experiences left-sided radiculopathy.  X-rays obtained at the last visit showed lumbar spondylosis, spondylolisthesis and facet joint arthropathy.  X-ray findings reviewed with the patient.  Back exercises were given.  Patient states physical therapy has helped.  She states she was told that she has leg length discrepancy which could be contributing to the back discomfort.  Advised patient to follow-up with her orthopedic surgeon if her symptoms get worse.  Poor balance - Patient was referred to PT. she has been doing some of the exercises at home.  Mixed hyperlipidemia  Primary hypertension-blood pressure was normal today.  History of breast cancer - 2010, status post lumpectomy, chemotherapy and radiation therapy.  Lymphedema Right arm and right breast  Prediabetes  Primary insomnia  Chronic angle-closure glaucoma of right eye, indeterminate stage  Osteoporosis screening - DEXA normal March 18, 2023.  Orders: No orders of the defined types were placed in this encounter.  No orders of the defined types were placed in this encounter.    Follow-Up Instructions: Return in about 4 months (around 04/17/2024), or if symptoms worsen or fail to improve, for Osteoarthritis.   Nicholas Bari, MD  Note - This record has been created using Animal nutritionist.  Chart creation errors have been sought, but may not always  have been located. Such creation errors do not reflect on  the standard of medical care.

## 2023-12-16 ENCOUNTER — Encounter: Payer: Self-pay | Admitting: Rheumatology

## 2023-12-16 ENCOUNTER — Ambulatory Visit: Payer: Medicaid Other | Attending: Rheumatology | Admitting: Rheumatology

## 2023-12-16 VITALS — BP 122/79 | HR 73 | Resp 16 | Ht 66.0 in | Wt 179.6 lb

## 2023-12-16 DIAGNOSIS — E782 Mixed hyperlipidemia: Secondary | ICD-10-CM

## 2023-12-16 DIAGNOSIS — M25511 Pain in right shoulder: Secondary | ICD-10-CM

## 2023-12-16 DIAGNOSIS — Z853 Personal history of malignant neoplasm of breast: Secondary | ICD-10-CM | POA: Diagnosis present

## 2023-12-16 DIAGNOSIS — M19042 Primary osteoarthritis, left hand: Secondary | ICD-10-CM | POA: Diagnosis present

## 2023-12-16 DIAGNOSIS — I10 Essential (primary) hypertension: Secondary | ICD-10-CM

## 2023-12-16 DIAGNOSIS — G8929 Other chronic pain: Secondary | ICD-10-CM

## 2023-12-16 DIAGNOSIS — M25572 Pain in left ankle and joints of left foot: Secondary | ICD-10-CM | POA: Diagnosis present

## 2023-12-16 DIAGNOSIS — H402214 Chronic angle-closure glaucoma, right eye, indeterminate stage: Secondary | ICD-10-CM

## 2023-12-16 DIAGNOSIS — R7303 Prediabetes: Secondary | ICD-10-CM | POA: Diagnosis present

## 2023-12-16 DIAGNOSIS — I890 Lymphedema, not elsewhere classified: Secondary | ICD-10-CM | POA: Diagnosis present

## 2023-12-16 DIAGNOSIS — R2689 Other abnormalities of gait and mobility: Secondary | ICD-10-CM

## 2023-12-16 DIAGNOSIS — M2550 Pain in unspecified joint: Secondary | ICD-10-CM | POA: Diagnosis present

## 2023-12-16 DIAGNOSIS — M48061 Spinal stenosis, lumbar region without neurogenic claudication: Secondary | ICD-10-CM

## 2023-12-16 DIAGNOSIS — M7062 Trochanteric bursitis, left hip: Secondary | ICD-10-CM | POA: Diagnosis present

## 2023-12-16 DIAGNOSIS — M19041 Primary osteoarthritis, right hand: Secondary | ICD-10-CM

## 2023-12-16 DIAGNOSIS — F5101 Primary insomnia: Secondary | ICD-10-CM | POA: Diagnosis present

## 2023-12-16 DIAGNOSIS — Z13820 Encounter for screening for osteoporosis: Secondary | ICD-10-CM
